# Patient Record
Sex: Male | Born: 1960 | Hispanic: No | Smoking: Never smoker
Health system: Southern US, Community
[De-identification: ages and names within clinical notes are randomized; demographics above are authoritative.]

## PROBLEM LIST (undated history)

## (undated) DIAGNOSIS — E119 Type 2 diabetes mellitus without complications: Secondary | ICD-10-CM

---

## 2019-04-25 ENCOUNTER — Ambulatory Visit: Payer: Self-pay | Attending: Internal Medicine

## 2019-04-25 DIAGNOSIS — Z23 Encounter for immunization: Secondary | ICD-10-CM

## 2019-04-25 NOTE — Progress Notes (Signed)
   Covid-19 Vaccination Clinic  Name:  Kenneth Richards    MRN: [id number] DOB: [birth year]  04/25/2019  [name] was observed post Covid-19 immunization for 15 minutes without incident. He was provided with Vaccine Information Sheet and instruction to access the V-Safe system.   [name] was instructed to call 911 with any severe reactions post vaccine: Marland Kitchen Difficulty breathing  . Swelling of face and throat  . A fast heartbeat  . A bad rash all over body  . Dizziness and weakness   Immunizations Administered    Name Date Dose VIS Date Route   Pfizer COVID-19 Vaccine 04/25/2019  8:54 AM 0.3 mL 12/28/2018 Intramuscular   Manufacturer: [employer], [employer]   Lot: [id number]   NDC: [address]-8

## 2019-05-20 ENCOUNTER — Ambulatory Visit: Payer: Self-pay | Attending: Internal Medicine

## 2019-05-20 DIAGNOSIS — Z23 Encounter for immunization: Secondary | ICD-10-CM

## 2019-05-20 NOTE — Progress Notes (Signed)
   Covid-19 Vaccination Clinic  Name:  Kenneth Richards    MRN: [id number] DOB: 06-02-1960  05/20/2019  [name] was observed post Covid-19 immunization for 15 minutes without incident. He was provided with Vaccine Information Sheet and instruction to access the V-Safe system.   [name] was instructed to call 911 with any severe reactions post vaccine: Marland Kitchen Difficulty breathing  . Swelling of face and throat  . A fast heartbeat  . A bad rash all over body  . Dizziness and weakness   Immunizations Administered    Name Date Dose VIS Date Route   Pfizer COVID-19 Vaccine 05/20/2019  8:18 AM 0.3 mL 03/13/2018 Intramuscular   Manufacturer: [employer], [employer]   Lot: [id number]   NDC: [address]-7

## 2020-10-29 ENCOUNTER — Telehealth: Payer: Self-pay

## 2020-10-29 NOTE — Telephone Encounter (Signed)
Pt to establish as a new patient. Would like to be seen sooner than appt. After [facility], he was told his glucose level was about 400mg /dl. Has only seen a doctor about diabetes years ago when he was still in prediabetic range. Does not take any medication for diabetes.

## 2020-10-30 ENCOUNTER — Emergency Department (HOSPITAL_COMMUNITY): Payer: Self-pay

## 2020-10-30 ENCOUNTER — Other Ambulatory Visit: Payer: Self-pay

## 2020-10-30 ENCOUNTER — Inpatient Hospital Stay (HOSPITAL_COMMUNITY)
Admission: EM | Admit: 2020-10-30 | Discharge: 2020-11-03 | DRG: 854 | Disposition: A | Discharge status: Home / Self Care | Payer: Self-pay | Attending: Family Medicine | Admitting: Family Medicine

## 2020-10-30 ENCOUNTER — Encounter (HOSPITAL_COMMUNITY): Payer: Self-pay | Admitting: Internal Medicine

## 2020-10-30 DIAGNOSIS — E871 Hypo-osmolality and hyponatremia: Secondary | ICD-10-CM

## 2020-10-30 DIAGNOSIS — L97509 Non-pressure chronic ulcer of other part of unspecified foot with unspecified severity: Secondary | ICD-10-CM

## 2020-10-30 DIAGNOSIS — M86071 Acute hematogenous osteomyelitis, right ankle and foot: Secondary | ICD-10-CM

## 2020-10-30 DIAGNOSIS — E08621 Diabetes mellitus due to underlying condition with foot ulcer: Secondary | ICD-10-CM

## 2020-10-30 DIAGNOSIS — L97516 Non-pressure chronic ulcer of other part of right foot with bone involvement without evidence of necrosis: Secondary | ICD-10-CM | POA: Diagnosis present

## 2020-10-30 DIAGNOSIS — E1169 Type 2 diabetes mellitus with other specified complication: Secondary | ICD-10-CM | POA: Diagnosis present

## 2020-10-30 DIAGNOSIS — M869 Osteomyelitis, unspecified: Secondary | ICD-10-CM

## 2020-10-30 DIAGNOSIS — A419 Sepsis, unspecified organism: Principal | ICD-10-CM | POA: Diagnosis present

## 2020-10-30 DIAGNOSIS — M86172 Other acute osteomyelitis, left ankle and foot: Secondary | ICD-10-CM | POA: Diagnosis present

## 2020-10-30 DIAGNOSIS — L03032 Cellulitis of left toe: Secondary | ICD-10-CM | POA: Diagnosis present

## 2020-10-30 DIAGNOSIS — E1165 Type 2 diabetes mellitus with hyperglycemia: Secondary | ICD-10-CM | POA: Diagnosis present

## 2020-10-30 DIAGNOSIS — L97529 Non-pressure chronic ulcer of other part of left foot with unspecified severity: Secondary | ICD-10-CM

## 2020-10-30 DIAGNOSIS — E11622 Type 2 diabetes mellitus with other skin ulcer: Secondary | ICD-10-CM | POA: Diagnosis present

## 2020-10-30 DIAGNOSIS — M86171 Other acute osteomyelitis, right ankle and foot: Secondary | ICD-10-CM | POA: Diagnosis present

## 2020-10-30 DIAGNOSIS — E1152 Type 2 diabetes mellitus with diabetic peripheral angiopathy with gangrene: Secondary | ICD-10-CM | POA: Diagnosis present

## 2020-10-30 DIAGNOSIS — E11621 Type 2 diabetes mellitus with foot ulcer: Secondary | ICD-10-CM

## 2020-10-30 DIAGNOSIS — L97526 Non-pressure chronic ulcer of other part of left foot with bone involvement without evidence of necrosis: Secondary | ICD-10-CM | POA: Diagnosis present

## 2020-10-30 DIAGNOSIS — Z20822 Contact with and (suspected) exposure to covid-19: Secondary | ICD-10-CM | POA: Diagnosis present

## 2020-10-30 DIAGNOSIS — L97519 Non-pressure chronic ulcer of other part of right foot with unspecified severity: Secondary | ICD-10-CM

## 2020-10-30 DIAGNOSIS — E1142 Type 2 diabetes mellitus with diabetic polyneuropathy: Secondary | ICD-10-CM | POA: Diagnosis present

## 2020-10-30 DIAGNOSIS — M86072 Acute hematogenous osteomyelitis, left ankle and foot: Secondary | ICD-10-CM

## 2020-10-30 DIAGNOSIS — E1140 Type 2 diabetes mellitus with diabetic neuropathy, unspecified: Secondary | ICD-10-CM | POA: Diagnosis present

## 2020-10-30 HISTORY — DX: Type 2 diabetes mellitus without complications: E11.9

## 2020-10-30 LAB — CBC WITH DIFFERENTIAL/PLATELET
Abs Immature Granulocytes: 0.14 10*3/uL — ABNORMAL HIGH (ref 0.00–0.07)
Basophils Absolute: 0.1 10*3/uL (ref 0.0–0.1)
Basophils Relative: 0 %
Eosinophils Absolute: 0 10*3/uL (ref 0.0–0.5)
Eosinophils Relative: 0 %
HCT: 36.3 % — ABNORMAL LOW (ref 39.0–52.0)
Hemoglobin: 12.2 g/dL — ABNORMAL LOW (ref 13.0–17.0)
Immature Granulocytes: 1 %
Lymphocytes Relative: 9 %
Lymphs Abs: 1.4 10*3/uL (ref 0.7–4.0)
MCH: 31.2 pg (ref 26.0–34.0)
MCHC: 33.6 g/dL (ref 30.0–36.0)
MCV: 92.8 fL (ref 80.0–100.0)
Monocytes Absolute: 0.6 10*3/uL (ref 0.1–1.0)
Monocytes Relative: 4 %
Neutro Abs: 12.8 10*3/uL — ABNORMAL HIGH (ref 1.7–7.7)
Neutrophils Relative %: 86 %
Platelets: 900 10*3/uL — ABNORMAL HIGH (ref 150–400)
RBC: 3.91 MIL/uL — ABNORMAL LOW (ref 4.22–5.81)
RDW: 12 % (ref 11.5–15.5)
WBC: 15.1 10*3/uL — ABNORMAL HIGH (ref 4.0–10.5)
nRBC: 0 % (ref 0.0–0.2)

## 2020-10-30 LAB — COMPREHENSIVE METABOLIC PANEL
ALT: 34 U/L (ref 0–44)
AST: 51 U/L — ABNORMAL HIGH (ref 15–41)
Albumin: 2.9 g/dL — ABNORMAL LOW (ref 3.5–5.0)
Alkaline Phosphatase: 109 U/L (ref 38–126)
Anion gap: 13 (ref 5–15)
BUN: 15 mg/dL (ref 8–23)
CO2: 21 mmol/L — ABNORMAL LOW (ref 22–32)
Calcium: 9 mg/dL (ref 8.9–10.3)
Chloride: 95 mmol/L — ABNORMAL LOW (ref 98–111)
Creatinine, Ser: 1.05 mg/dL (ref 0.61–1.24)
GFR, Estimated: >60 mL/min (ref >60)
Glucose, Bld: 261 mg/dL — ABNORMAL HIGH (ref 70–99)
Potassium: 4.4 mmol/L (ref 3.5–5.1)
Sodium: 129 mmol/L — ABNORMAL LOW (ref 135–145)
Total Bilirubin: 0.5 mg/dL (ref 0.3–1.2)
Total Protein: 8.3 g/dL — ABNORMAL HIGH (ref 6.5–8.1)

## 2020-10-30 LAB — RESP PANEL BY RT-PCR (FLU A&B, COVID) ARPGX2
Influenza A by PCR: NEGATIVE
Influenza B by PCR: NEGATIVE
SARS Coronavirus 2 by RT PCR: NEGATIVE

## 2020-10-30 LAB — APTT: aPTT: 32 seconds (ref 24–36)

## 2020-10-30 LAB — CBG MONITORING, ED
Glucose-Capillary: 223 mg/dL — ABNORMAL HIGH (ref 70–99)
Glucose-Capillary: 207 mg/dL — ABNORMAL HIGH (ref 70–99)

## 2020-10-30 LAB — PROTIME-INR
INR: 1.1 (ref 0.8–1.2)
Prothrombin Time: 14.2 seconds (ref 11.4–15.2)

## 2020-10-30 LAB — HEMOGLOBIN A1C
Hgb A1c MFr Bld: 10.3 % — ABNORMAL HIGH (ref 4.8–5.6)
Mean Plasma Glucose: 248.91 mg/dL

## 2020-10-30 LAB — LACTIC ACID, PLASMA
Lactic Acid, Venous: 1.9 mmol/L (ref 0.5–1.9)
Lactic Acid, Venous: 2.1 mmol/L (ref 0.5–1.9)

## 2020-10-30 LAB — TSH: TSH: 2.147 u[IU]/mL (ref 0.350–4.500)

## 2020-10-30 MED ORDER — METRONIDAZOLE 500 MG/100ML IV SOLN
500.0000 mg | Freq: Two times a day (BID) | INTRAVENOUS | Status: DC
  Administered 2020-10-30 – 2020-11-02 (×6): 500 mg via INTRAVENOUS
  Filled 2020-10-30 (×6): qty 100

## 2020-10-30 MED ORDER — LACTATED RINGERS IV BOLUS (SEPSIS)
1000.0000 mL | Freq: Once | INTRAVENOUS | Status: AC
  Administered 2020-10-30: 1000 mL via INTRAVENOUS

## 2020-10-30 MED ORDER — ENOXAPARIN SODIUM 40 MG/0.4ML IJ SOSY
40.0000 mg | PREFILLED_SYRINGE | INTRAMUSCULAR | Status: DC
  Administered 2020-10-30: 40 mg via SUBCUTANEOUS
  Filled 2020-10-30: qty 0.4

## 2020-10-30 MED ORDER — INSULIN ASPART 100 UNIT/ML IJ SOLN
0.0000 [IU] | Freq: Three times a day (TID) | INTRAMUSCULAR | Status: DC
  Administered 2020-10-31: 8 [IU] via SUBCUTANEOUS
  Administered 2020-10-31 – 2020-11-01 (×2): 5 [IU] via SUBCUTANEOUS
  Administered 2020-11-01: 8 [IU] via SUBCUTANEOUS
  Administered 2020-11-02: 3 [IU] via SUBCUTANEOUS
  Administered 2020-11-02: 5 [IU] via SUBCUTANEOUS
  Administered 2020-11-02: 2 [IU] via SUBCUTANEOUS
  Administered 2020-11-03: 5 [IU] via SUBCUTANEOUS

## 2020-10-30 MED ORDER — SODIUM CHLORIDE 0.9 % IV SOLN
2.0000 g | INTRAVENOUS | Status: DC
  Administered 2020-10-31 – 2020-11-02 (×3): 2 g via INTRAVENOUS
  Filled 2020-10-30 (×3): qty 20

## 2020-10-30 MED ORDER — VANCOMYCIN HCL 1500 MG/300ML IV SOLN
1500.0000 mg | Freq: Once | INTRAVENOUS | Status: AC
  Administered 2020-10-30: 1500 mg via INTRAVENOUS
  Filled 2020-10-30: qty 300

## 2020-10-30 MED ORDER — ONDANSETRON HCL 4 MG/2ML IJ SOLN
4.0000 mg | Freq: Four times a day (QID) | INTRAMUSCULAR | Status: DC | PRN
  Administered 2020-11-01: 4 mg via INTRAVENOUS
  Filled 2020-10-30: qty 2

## 2020-10-30 MED ORDER — INSULIN ASPART 100 UNIT/ML IJ SOLN
0.0000 [IU] | Freq: Every day | INTRAMUSCULAR | Status: DC
  Administered 2020-10-30: 2 [IU] via SUBCUTANEOUS
  Administered 2020-10-31 – 2020-11-01 (×2): 3 [IU] via SUBCUTANEOUS

## 2020-10-30 MED ORDER — SODIUM CHLORIDE 0.9 % IV SOLN: INTRAVENOUS | Status: DC

## 2020-10-30 MED ORDER — VANCOMYCIN HCL 750 MG/150ML IV SOLN
750.0000 mg | Freq: Two times a day (BID) | INTRAVENOUS | Status: DC
  Administered 2020-10-31 – 2020-11-02 (×5): 750 mg via INTRAVENOUS
  Filled 2020-10-30 (×6): qty 150

## 2020-10-30 MED ORDER — LACTATED RINGERS IV BOLUS (SEPSIS)
1000.0000 mL | Freq: Once | INTRAVENOUS | Status: AC
Start: 2020-10-30 — End: 2020-10-30
  Administered 2020-10-30: 1000 mL via INTRAVENOUS

## 2020-10-30 MED ORDER — LACTATED RINGERS IV SOLN: INTRAVENOUS | Status: DC

## 2020-10-30 MED ORDER — ACETAMINOPHEN 650 MG RE SUPP: 650.0000 mg | Freq: Four times a day (QID) | RECTAL | Status: DC | PRN

## 2020-10-30 MED ORDER — ACETAMINOPHEN 325 MG PO TABS: 650.0000 mg | ORAL_TABLET | Freq: Four times a day (QID) | ORAL | Status: DC | PRN

## 2020-10-30 MED ORDER — LACTATED RINGERS IV BOLUS (SEPSIS)
250.0000 mL | Freq: Once | INTRAVENOUS | Status: AC
  Administered 2020-10-30: 250 mL via INTRAVENOUS

## 2020-10-30 MED ORDER — SODIUM CHLORIDE 0.9 % IV SOLN
2.0000 g | Freq: Once | INTRAVENOUS | Status: AC
  Administered 2020-10-30: 2 g via INTRAVENOUS
  Filled 2020-10-30: qty 20

## 2020-10-30 MED ORDER — ONDANSETRON HCL 4 MG PO TABS: 4.0000 mg | ORAL_TABLET | Freq: Four times a day (QID) | ORAL | Status: DC | PRN

## 2020-10-30 NOTE — Assessment & Plan Note (Signed)
Pt aware he is likely going to need amputations of toe due to nature of his infections. See above plan for MRI, ABI, abx.

## 2020-10-30 NOTE — Progress Notes (Signed)
Pharmacy Antibiotic Note  Kenneth Richards is a 60 y.o. male admitted on 10/30/2020 presenting with foot infection, DFI and concern for sepsis.  Pharmacy has been consulted for vancomycin dosing.  Ceftriaxone per MD  Plan: Vancomycin 1500 mg IV x 1, then 750 mg IV q 12h (eAUC 431, Goal AUC 400-550, SCr 1.05) Monitor renal function, Cx and clinical progression to narrow Vancomycin levels as needed  Height: 6' (182.9 cm) Weight: 72.6 kg (160 lb) IBW/kg (Calculated) : 77.6  Temp (24hrs), Avg:100.1 F (37.8 C), Min:100.1 F (37.8 C), Max:100.1 F (37.8 C)  Recent Labs  Lab 10/30/20 1617 10/30/20 1730  WBC  --  15.1*  CREATININE  --  1.05  LATICACIDVEN 1.9  --     Estimated Creatinine Clearance: 74.9 mL/min (by C-G formula based on SCr of 1.05 mg/dL).    Not on File  Daylene Posey, PharmD Clinical Pharmacist ED Pharmacist Phone # [phone] 10/30/2020 6:18 PM

## 2020-10-30 NOTE — H&P (Addendum)
History and Physical    Kenneth Richards:[id number] DOB: 09-Jan-1961 DOA: 10/30/2020  PCP: Pcp, No   Patient coming from: Home  I have personally briefly reviewed patient's old medical records in [facility]  CC: foot wounds x 2-3 weeks HPI: 60 year old white male with a prior history of type 2 diabetes who takes no medications who presents to the ER today with a 2 to 3-week history of worsening wounds to his feet.  Patient states that he may have dropped a wooden board on them about 2 to 3 weeks ago.  He has very little sensation in his feet.  He states that the skin started coming off of his right first toe and then the third left toe proximal around the same time.  Again does not have any sensation of his feet.  He started noticing odor coming from his feet about 2 weeks ago.  He states that he has been try to clean them at home without success.  The odor is gotten progressively worse.  Denies any fever chills.  Patient takes no medications for his diabetes.  Patient came to the ER for evaluation.  On arrival patient had a low-grade temp of 100.1.  Blood pressure 104/74.  100% on room air  Laboratory evaluation White count 15.1 hemoglobin 12.2 platelets of 900.  Chemistry sodium 129, glucose of 261, BUN of 15 creatinine 1.0, bicarbonate of 21.  Lactic acid was 1.9.  X-ray right foot demonstrated osteomyelitis is of first toe.  Due to the patient's obvious diabetic foot wounds and probable osteomyelitis, [facility] contacted for admission.   ED Course: pt with foul odor from feet. WBC 15. Xray shows acute osteo right foot. Pt admitted for workup.  Review of Systems:  Review of Systems  Constitutional:  Negative for chills and fever.  HENT: Negative.    Eyes: Negative.   Respiratory: Negative.    Cardiovascular: Negative.   Gastrointestinal: Negative.   Genitourinary: Negative.   Musculoskeletal: Negative.   Skin:        Worsening erythema, drainage and foul odor  from feet  Neurological: Negative.        Little to no sensation in his feet  Endo/Heme/Allergies:        Does not check his blood sugar  Psychiatric/Behavioral: Negative.    All other systems reviewed and are negative.  Past Medical History:  Diagnosis Date   Type 2 diabetes mellitus (HCC)     History reviewed. No pertinent surgical history.   reports that he does not drink alcohol and does not use drugs. No history on file for tobacco use.  No Known Allergies  History reviewed. No pertinent family history.  Prior to Admission medications   Medication Sig Start Date End Date Taking? Authorizing Provider  sulfamethoxazole-trimethoprim (BACTRIM DS) 800-160 MG tablet Take 1 tablet by mouth 2 (two) times daily. 10/27/20   [provider]    Physical Exam: Vitals:   10/30/20 1641 10/30/20 1837 10/30/20 1946 10/30/20 2045  BP:  96/69 101/68 126/86  Pulse:  86 85 82  Resp:  17 16 19   Temp:   98.2 F (36.8 C)   TempSrc:   Oral   SpO2:  100% 100% 100%  Weight: 72.6 kg     Height: 6' (1.829 m)       Physical Exam Vitals and nursing note reviewed.  Constitutional:      General: He is not in acute distress.    Appearance: Normal appearance.  He is normal weight. He is not ill-appearing, toxic-appearing or diaphoretic.  HENT:     Head: Normocephalic and atraumatic.     Nose: Nose normal. No rhinorrhea.  Eyes:     General:        Right eye: No discharge.        Left eye: No discharge.  Cardiovascular:     Rate and Rhythm: Normal rate and regular rhythm.     Pulses: Normal pulses.  Pulmonary:     Effort: Pulmonary effort is normal. No respiratory distress.     Breath sounds: Normal breath sounds. No wheezing or rales.  Abdominal:     General: Abdomen is flat. Bowel sounds are normal. There is no distension.     Tenderness: There is no abdominal tenderness. There is no guarding.  Skin:    Capillary Refill: Capillary refill takes less than 2 seconds.      Comments: Ulceration to right 1st toe. Dactylitis of 3rd left toe See pictures  Neurological:     General: No focal deficit present.     Mental Status: He is alert and oriented to person, place, and time.     Comments: Little to no sensation to light touch to plantar aspect of bilateral feet              Labs on Admission: I have personally reviewed following labs and imaging studies  CBC: Recent Labs  Lab 10/30/20 1730  WBC 15.1*  NEUTROABS 12.8*  HGB 12.2*  HCT 36.3*  MCV 92.8  PLT 900*   Basic Metabolic Panel: Recent Labs  Lab 10/30/20 1730  NA 129*  K 4.4  CL 95*  CO2 21*  GLUCOSE 261*  BUN 15  CREATININE 1.05  CALCIUM 9.0   GFR: Estimated Creatinine Clearance: 74.9 mL/min (by C-G formula based on SCr of 1.05 mg/dL). Liver Function Tests: Recent Labs  Lab 10/30/20 1730  AST 51*  ALT 34  ALKPHOS 109  BILITOT 0.5  PROT 8.3*  ALBUMIN 2.9*   No results for input(s): LIPASE, AMYLASE in the last 168 hours. No results for input(s): AMMONIA in the last 168 hours. Coagulation Profile: Recent Labs  Lab 10/30/20 1730  INR 1.1   Cardiac Enzymes: No results for input(s): CKTOTAL, CKMB, CKMBINDEX, TROPONINI in the last 168 hours. BNP (last 3 results) No results for input(s): PROBNP in the last 8760 hours. HbA1C: No results for input(s): HGBA1C in the last 72 hours. CBG: Recent Labs  Lab 10/30/20 1641  GLUCAP 223*   Lipid Profile: No results for input(s): CHOL, HDL, LDLCALC, TRIG, CHOLHDL, LDLDIRECT in the last 72 hours. Thyroid Function Tests: No results for input(s): TSH, T4TOTAL, FREET4, T3FREE, THYROIDAB in the last 72 hours. Anemia Panel: No results for input(s): VITAMINB12, FOLATE, FERRITIN, TIBC, IRON, RETICCTPCT in the last 72 hours. Urine analysis: No results found for: COLORURINE, APPEARANCEUR, LABSPEC, PHURINE, GLUCOSEU, HGBUR, BILIRUBINUR, KETONESUR, PROTEINUR, UROBILINOGEN, NITRITE, LEUKOCYTESUR  Radiological Exams on Admission:  I have personally reviewed images DG Chest 1 View  Result Date: 10/30/2020 CLINICAL DATA:  Questionable sepsis EXAM: CHEST  1 VIEW COMPARISON:  Stent FINDINGS: The heart and mediastinal contours are within normal limits. No focal consolidation. No pulmonary edema. No pleural effusion. No pneumothorax. No acute osseous abnormality. IMPRESSION: No active disease. Electronically Signed   By: Tish Frederickson M.D.   On: 10/30/2020 18:14   DG Foot Complete Left  Result Date: 10/30/2020 CLINICAL DATA:  Bilateral toe infections.  Diabetic. EXAM: LEFT FOOT -  COMPLETE 3+ VIEW COMPARISON:  None. FINDINGS: Soft tissue infection and/or bandage material about the distal third digit. No soft tissue gas. Hallux valgus deformity with degenerative changes of the first metatarsophalangeal joint. Diffuse mild forefoot and midfoot soft tissue swelling. Questionable cortical irregularity involving the distal third digit. No other osseous destruction identified. IMPRESSION: Soft tissue infection and/or bandage material about the distal third digit. Presuming there is bandage material, makes evaluation of osseous structures challenging. Cannot exclude cortical irregularity involving the tuft of the third digit. Consider MRI to exclude osteomyelitis. Electronically Signed   By: Jeronimo Greaves M.D.   On: 10/30/2020 18:19   DG Foot Complete Right  Result Date: 10/30/2020 CLINICAL DATA:  Question sepsis. non compliant diabetic d/t being unaware he is diabetic. Open sore on right foot. Toes significant decay EXAM: RIGHT FOOT COMPLETE - 3+ VIEW COMPARISON:  None. FINDINGS: Markedly limited evaluation due to overlapping gauze overlying the first digit distal phalanx. Cortical erosion and destruction of the lateral first digit distal phalanx. There is no evidence of fracture or dislocation. There is no evidence of severe arthropathy. Subcutaneus soft tissue edema of the first digit. IMPRESSION: Cortical erosion and destruction of the  lateral first digit distal phalanx consistent with osteomyelitis. Electronically Signed   By: Tish Frederickson M.D.   On: 10/30/2020 18:26    EKG: I have personally reviewed EKG: shows NSR   Assessment/Plan Principal Problem:   Bilateral diabetic foot ulcer associated with secondary diabetes mellitus (HCC) Active Problems:   Diabetic foot ulcer (HCC)   Acute osteomyelitis of both feet (HCC)   Type 2 diabetes mellitus with foot ulcer (HCC)   Hyponatremia    Bilateral diabetic foot ulcer associated with secondary diabetes mellitus (HCC) Admit to medical bed. IV rocephin, flagyl and vanco. MRI bilateral feet. Pt has pulses in both feet. Check ABI. Pt will need consults with podiatry, vascular surgery.  Acute osteomyelitis of both feet (HCC) Pt aware he is likely going to need amputations of toe due to nature of his infections. See above plan for MRI, ABI, abx.  Type 2 diabetes mellitus with foot ulcer (HCC) Check A1c. Add SSI.  Hyponatremia Check TSH. Gentle IVF with NS  DVT prophylaxis: Lovenox Code Status: Full Code Family Communication: discussed with pt at bedside. No family at bedside  Disposition Plan: return home  Consults called: none  Admission status: Inpatient, Med-Surg   Carollee Herter, DO [facility] 10/30/2020, 9:16 PM

## 2020-10-30 NOTE — ED Provider Notes (Signed)
[facility] EMERGENCY DEPARTMENT Provider Note   CSN: [id number] Arrival date & time: 10/30/20  1617     History Chief Complaint  Patient presents with   Nail Problem    Kenneth Richards is a 60 y.o. male.  The history is provided by the patient and medical records. No language interpreter was used.  Rash Location:  Toe and foot Foot rash location:  L toes and R toes Toe rash location:  L third toe and R great toe Quality: blistering and redness   Severity:  Moderate Onset quality:  Gradual Duration:  2 weeks Timing:  Constant Progression:  Worsening Chronicity:  New Relieved by:  Nothing Worsened by:  Nothing Ineffective treatments:  None tried Associated symptoms: induration   Associated symptoms: no abdominal pain, no diarrhea, no fatigue, no fever, no headaches, no nausea, no shortness of breath, not vomiting and not wheezing       No past medical history on file.  There are no problems to display for this patient.   History reviewed. No pertinent surgical history.     No family history on file.     Home Medications Prior to Admission medications   Not on File    Allergies    Patient has no allergy information on record.  Review of Systems   Review of Systems  Constitutional:  Negative for chills, diaphoresis, fatigue and fever.  HENT:  Negative for congestion.   Eyes:  Negative for visual disturbance.  Respiratory:  Negative for cough, chest tightness, shortness of breath and wheezing.   Cardiovascular:  Negative for chest pain.  Gastrointestinal:  Negative for abdominal pain, constipation, diarrhea, nausea and vomiting.  Genitourinary:  Negative for dysuria and frequency.  Musculoskeletal:  Negative for back pain, neck pain and neck stiffness.  Skin:  Positive for rash and wound.  Neurological:  Negative for headaches.  Psychiatric/Behavioral:  Negative for agitation.   All other systems reviewed and are  negative.  Physical Exam Updated Vital Signs BP 101/68 (BP Location: Left Arm)   Pulse 85   Temp 98.2 F (36.8 C) (Oral)   Resp 16   Ht 6' (1.829 m)   Wt 72.6 kg   SpO2 100%   BMI 21.70 kg/m   Physical Exam Vitals and nursing note reviewed.  Constitutional:      General: He is not in acute distress.    Appearance: He is well-developed. He is not ill-appearing, toxic-appearing or diaphoretic.  HENT:     Head: Normocephalic and atraumatic.     Nose: No congestion or rhinorrhea.     Mouth/Throat:     Mouth: Mucous membranes are moist.     Pharynx: No oropharyngeal exudate or posterior oropharyngeal erythema.  Eyes:     Extraocular Movements: Extraocular movements intact.     Conjunctiva/sclera: Conjunctivae normal.     Pupils: Pupils are equal, round, and reactive to light.  Cardiovascular:     Rate and Rhythm: Normal rate and regular rhythm.     Heart sounds: No murmur heard. Pulmonary:     Effort: Pulmonary effort is normal. No respiratory distress.     Breath sounds: Normal breath sounds. No wheezing, rhonchi or rales.  Chest:     Chest wall: No tenderness.  Abdominal:     General: Abdomen is flat.     Palpations: Abdomen is soft.     Tenderness: There is no abdominal tenderness. There is no right CVA tenderness, left CVA tenderness, guarding or  rebound.  Musculoskeletal:        General: No tenderness.     Cervical back: Neck supple.  Skin:    General: Skin is warm and dry.     Capillary Refill: Capillary refill takes less than 2 seconds.     Findings: Erythema and rash present.  Neurological:     General: No focal deficit present.     Mental Status: He is alert.  Psychiatric:        Mood and Affect: Mood normal.               ED Results / Procedures / Treatments   Labs (all labs ordered are listed, but only abnormal results are displayed) Labs Reviewed  LACTIC ACID, PLASMA - Abnormal; Notable for the following components:      Result Value    Lactic Acid, Venous 2.1 (*)    All other components within normal limits  COMPREHENSIVE METABOLIC PANEL - Abnormal; Notable for the following components:   Sodium 129 (*)    Chloride 95 (*)    CO2 21 (*)    Glucose, Bld 261 (*)    Total Protein 8.3 (*)    Albumin 2.9 (*)    AST 51 (*)    All other components within normal limits  CBC WITH DIFFERENTIAL/PLATELET - Abnormal; Notable for the following components:   WBC 15.1 (*)    RBC 3.91 (*)    Hemoglobin 12.2 (*)    HCT 36.3 (*)    Platelets 900 (*)    Neutro Abs 12.8 (*)    Abs Immature Granulocytes 0.14 (*)    All other components within normal limits  CBG MONITORING, ED - Abnormal; Notable for the following components:   Glucose-Capillary 223 (*)    All other components within normal limits  CBG MONITORING, ED - Abnormal; Notable for the following components:   Glucose-Capillary 207 (*)    All other components within normal limits  RESP PANEL BY RT-PCR (FLU A&B, COVID) ARPGX2  CULTURE, BLOOD (ROUTINE X 2)  CULTURE, BLOOD (ROUTINE X 2)  URINE CULTURE  LACTIC ACID, PLASMA  PROTIME-INR  APTT  TSH  URINALYSIS, ROUTINE W REFLEX MICROSCOPIC  MISCELLANEOUS GENETIC TEST  HEMOGLOBIN A1C  HIV ANTIBODY (ROUTINE TESTING W REFLEX)  CREATININE, SERUM  SEDIMENTATION RATE  C-REACTIVE PROTEIN  PREALBUMIN  CBC WITH DIFFERENTIAL/PLATELET  COMPREHENSIVE METABOLIC PANEL    EKG None  Radiology DG Chest 1 View  Result Date: 10/30/2020 CLINICAL DATA:  Questionable sepsis EXAM: CHEST  1 VIEW COMPARISON:  Stent FINDINGS: The heart and mediastinal contours are within normal limits. No focal consolidation. No pulmonary edema. No pleural effusion. No pneumothorax. No acute osseous abnormality. IMPRESSION: No active disease. Electronically Signed   By: Tish Frederickson M.D.   On: 10/30/2020 18:14   DG Foot Complete Left  Result Date: 10/30/2020 CLINICAL DATA:  Bilateral toe infections.  Diabetic. EXAM: LEFT FOOT - COMPLETE 3+ VIEW  COMPARISON:  None. FINDINGS: Soft tissue infection and/or bandage material about the distal third digit. No soft tissue gas. Hallux valgus deformity with degenerative changes of the first metatarsophalangeal joint. Diffuse mild forefoot and midfoot soft tissue swelling. Questionable cortical irregularity involving the distal third digit. No other osseous destruction identified. IMPRESSION: Soft tissue infection and/or bandage material about the distal third digit. Presuming there is bandage material, makes evaluation of osseous structures challenging. Cannot exclude cortical irregularity involving the tuft of the third digit. Consider MRI to exclude osteomyelitis. Electronically Signed  By: Jeronimo Greaves M.D.   On: 10/30/2020 18:19   DG Foot Complete Right  Result Date: 10/30/2020 CLINICAL DATA:  Question sepsis. non compliant diabetic d/t being unaware he is diabetic. Open sore on right foot. Toes significant decay EXAM: RIGHT FOOT COMPLETE - 3+ VIEW COMPARISON:  None. FINDINGS: Markedly limited evaluation due to overlapping gauze overlying the first digit distal phalanx. Cortical erosion and destruction of the lateral first digit distal phalanx. There is no evidence of fracture or dislocation. There is no evidence of severe arthropathy. Subcutaneus soft tissue edema of the first digit. IMPRESSION: Cortical erosion and destruction of the lateral first digit distal phalanx consistent with osteomyelitis. Electronically Signed   By: Tish Frederickson M.D.   On: 10/30/2020 18:26    Procedures Procedures   CRITICAL CARE Performed by: Canary Brim Rexton Greulich Total critical care time: 35 minutes Critical care time was exclusive of separately billable procedures and treating other patients. Critical care was necessary to treat or prevent imminent or life-threatening deterioration. Critical care was time spent personally by me on the following activities: development of treatment plan with patient and/or  surrogate as well as nursing, discussions with consultants, evaluation of patient's response to treatment, examination of patient, obtaining history from patient or surrogate, ordering and performing treatments and interventions, ordering and review of laboratory studies, ordering and review of radiographic studies, pulse oximetry and re-evaluation of patient's condition.   Medications Ordered in ED Medications  lactated ringers infusion ( Intravenous Not Given 10/30/20 2211)  vancomycin (VANCOREADY) IVPB 1500 mg/300 mL (1,500 mg Intravenous New Bag/Given 10/30/20 2229)  vancomycin (VANCOREADY) IVPB 750 mg/150 mL (has no administration in time range)  insulin aspart (novoLOG) injection 0-15 Units (has no administration in time range)  insulin aspart (novoLOG) injection 0-5 Units (2 Units Subcutaneous Given 10/30/20 2230)  enoxaparin (LOVENOX) injection 40 mg (40 mg Subcutaneous Given 10/30/20 2232)  acetaminophen (TYLENOL) tablet 650 mg (has no administration in time range)    Or  acetaminophen (TYLENOL) suppository 650 mg (has no administration in time range)  ondansetron (ZOFRAN) tablet 4 mg (has no administration in time range)    Or  ondansetron (ZOFRAN) injection 4 mg (has no administration in time range)  cefTRIAXone (ROCEPHIN) 2 g in sodium chloride 0.9 % 100 mL IVPB (has no administration in time range)  metroNIDAZOLE (FLAGYL) IVPB 500 mg (500 mg Intravenous New Bag/Given 10/30/20 2230)  0.9 %  sodium chloride infusion ( Intravenous New Bag/Given 10/30/20 2229)  lactated ringers bolus 1,000 mL (0 mLs Intravenous Stopped 10/30/20 2227)    And  lactated ringers bolus 1,000 mL (0 mLs Intravenous Stopped 10/30/20 2227)    And  lactated ringers bolus 250 mL (0 mLs Intravenous Stopped 10/30/20 2254)  cefTRIAXone (ROCEPHIN) 2 g in sodium chloride 0.9 % 100 mL IVPB (0 g Intravenous Stopped 10/30/20 2227)    ED Course  I have reviewed the triage vital signs and the nursing  notes.  Pertinent labs & imaging results that were available during my care of the patient were reviewed by me and considered in my medical decision making (see chart for details).    MDM Rules/Calculators/A&P                           SPENCE SOBERANO is a 60 y.o. male with a reported recent diagnosis of diabetes who presents with foot infection.  Patient reports that for the last few weeks he has had concern  for wounds on his toes and over the last several days started to worsen.  He is concerned they are very infected because they have foul smell and discharge.  He reports he has peripheral neuropathy and does not have much sensation at baseline.  He denies any systemic signs infection with no fevers, chills, nausea vomiting, constipation, diarrhea, or other complaints.  He says that the redness started streaking up his left foot coming from the left third toe.  He reports he also has his right great toe appears infected as does the lateral right foot.  Otherwise he denies other complaints.  On exam, patient has foul-smelling wounds on his toes.  See clinical photos.  Had a temperature 100.1 on arrival and did feel warm to the touch.  Lungs clear and chest nontender.  Abdomen nontender.  Redness was going from the foot.  Patient had work-up started in triage including x-ray showing evidence of osteomyelitis.  Due to the appearance of the toes, I am concerned about osteomyelitis and developing gangrene.  Patient was given broad-spectrum antibiotics and he will be admitted to medicine for further management.      Final Clinical Impression(s) / ED Diagnoses Final diagnoses:  Sepsis (HCC)  Osteomyelitis of left foot, unspecified type (HCC)     Clinical Impression: 1. Sepsis (HCC)   2. Osteomyelitis of left foot, unspecified type (HCC)     Disposition: Admit  This note was prepared with assistance of Dragon voice recognition software. Occasional wrong-word or sound-a-like substitutions  may have occurred due to the inherent limitations of voice recognition software.     Gale Klar, Canary Brim, MD 10/31/20 [phone]

## 2020-10-30 NOTE — Assessment & Plan Note (Signed)
Admit to medical bed. IV rocephin, flagyl and vanco. MRI bilateral feet. Pt has pulses in both feet. Check ABI. Pt will need consults with podiatry, vascular surgery.

## 2020-10-30 NOTE — Assessment & Plan Note (Signed)
Check A1c. Add SSI. 

## 2020-10-30 NOTE — ED Notes (Signed)
MD Tegeler notified of lactic

## 2020-10-30 NOTE — ED Triage Notes (Signed)
Pt here POV d/t bilateral toe infections. Pt non compliant diabetic d/t being unaware he is diabetic. Pedal pulses intact. Open sore on right foot. Toes significant decay.

## 2020-10-30 NOTE — Assessment & Plan Note (Signed)
Check TSH. Gentle IVF with NS

## 2020-10-30 NOTE — Subjective & Objective (Signed)
CC: foot wounds x 2-3 weeks HPI: 60 year old white male with a prior history of type 2 diabetes who takes no medications who presents to the ER today with a 2 to 3-week history of worsening wounds to his feet.  Patient states that he may have dropped a wooden board on them about 2 to 3 weeks ago.  He has very little sensation in his feet.  He states that the skin started coming off of his right first toe and then the third left toe proximal around the same time.  Again does not have any sensation of his feet.  He started noticing odor coming from his feet about 2 weeks ago.  He states that he has been try to clean them at home without success.  The odor is gotten progressively worse.  Denies any fever chills.  Patient takes no medications for his diabetes.  Patient came to the ER for evaluation.  On arrival patient had a low-grade temp of 100.1.  Blood pressure 104/74.  100% on room air  Laboratory evaluation White count 15.1 hemoglobin 12.2 platelets of 900.  Chemistry sodium 129, glucose of 261, BUN of 15 creatinine 1.0, bicarbonate of 21.  Lactic acid was 1.9.  X-ray right foot demonstrated osteomyelitis is of first toe.  Due to the patient's obvious diabetic foot wounds and probable osteomyelitis, [facility] hospitalist contacted for admission.

## 2020-10-30 NOTE — ED Provider Notes (Signed)
Emergency Medicine Provider Triage Evaluation Note  Kenneth Richards , a 60 y.o. male  was evaluated in triage.  Pt complains of foot infection.  Review of Systems  Positive: Infection to bilateral foot, pain Negative: Fever, n/v  Physical Exam  BP 104/74 (BP Location: Right Arm)   Pulse 97   Temp 100.1 F (37.8 C) (Oral)   Resp 16   Ht 6' (1.829 m)   Wt 72.6 kg   SpO2 100%   BMI 21.70 kg/m  Gen:   Awake, no distress   Resp:  Normal effort  MSK:   Moves extremities without difficulty  Other:  Diabetic ulcers noted to multiple spots on both feet, with grossly infected toes to both feet.  Erythema to dorsum of L foot  Medical Decision Making  Medically screening exam initiated at 4:50 PM.  Appropriate orders placed.  Celene Squibb was informed that the remainder of the evaluation will be completed by another provider, this initial triage assessment does not replace that evaluation, and the importance of remaining in the ED until their evaluation is complete.  Pt with diabetic foot ulcers that appears infected, especially the L foot involving multiple toes.  Concern for sepsis.  Code sepsis initiated.    Fayrene Helper, PA-C 10/30/20 1657    Terrilee Files, MD 10/30/20 [phone]

## 2020-10-30 NOTE — Sepsis Progress Note (Signed)
Code Sepsis protocol being monitored by eLink. 

## 2020-10-30 NOTE — ED Notes (Signed)
Lab called 2 to add on creatinine, sed rate, CRP, and prealbumin

## 2020-10-31 ENCOUNTER — Inpatient Hospital Stay (HOSPITAL_COMMUNITY): Payer: Self-pay

## 2020-10-31 LAB — URINALYSIS, ROUTINE W REFLEX MICROSCOPIC
Bilirubin Urine: NEGATIVE
Glucose, UA: 150 mg/dL — AB
Hgb urine dipstick: NEGATIVE
Ketones, ur: NEGATIVE mg/dL
Leukocytes,Ua: NEGATIVE
Nitrite: NEGATIVE
Protein, ur: NEGATIVE mg/dL
Specific Gravity, Urine: 1.009 (ref 1.005–1.030)
pH: 6 (ref 5.0–8.0)

## 2020-10-31 LAB — HIV ANTIBODY (ROUTINE TESTING W REFLEX): HIV Screen 4th Generation wRfx: NONREACTIVE

## 2020-10-31 LAB — COMPREHENSIVE METABOLIC PANEL
ALT: 26 U/L (ref 0–44)
AST: 22 U/L (ref 15–41)
Albumin: 2.2 g/dL — ABNORMAL LOW (ref 3.5–5.0)
Alkaline Phosphatase: 88 U/L (ref 38–126)
Anion gap: 8 (ref 5–15)
BUN: 11 mg/dL (ref 8–23)
CO2: 22 mmol/L (ref 22–32)
Calcium: 8.2 mg/dL — ABNORMAL LOW (ref 8.9–10.3)
Chloride: 101 mmol/L (ref 98–111)
Creatinine, Ser: 0.77 mg/dL (ref 0.61–1.24)
GFR, Estimated: >60 mL/min (ref >60)
Glucose, Bld: 238 mg/dL — ABNORMAL HIGH (ref 70–99)
Potassium: 3.9 mmol/L (ref 3.5–5.1)
Sodium: 131 mmol/L — ABNORMAL LOW (ref 135–145)
Total Bilirubin: 0.5 mg/dL (ref 0.3–1.2)
Total Protein: 6.6 g/dL (ref 6.5–8.1)

## 2020-10-31 LAB — CBC WITH DIFFERENTIAL/PLATELET
Abs Immature Granulocytes: 0.16 10*3/uL — ABNORMAL HIGH (ref 0.00–0.07)
Basophils Absolute: 0.1 10*3/uL (ref 0.0–0.1)
Basophils Relative: 1 %
Eosinophils Absolute: 0 10*3/uL (ref 0.0–0.5)
Eosinophils Relative: 0 %
HCT: 32.3 % — ABNORMAL LOW (ref 39.0–52.0)
Hemoglobin: 11.1 g/dL — ABNORMAL LOW (ref 13.0–17.0)
Immature Granulocytes: 1 %
Lymphocytes Relative: 14 %
Lymphs Abs: 1.9 10*3/uL (ref 0.7–4.0)
MCH: 31.4 pg (ref 26.0–34.0)
MCHC: 34.4 g/dL (ref 30.0–36.0)
MCV: 91.5 fL (ref 80.0–100.0)
Monocytes Absolute: 0.7 10*3/uL (ref 0.1–1.0)
Monocytes Relative: 5 %
Neutro Abs: 10.4 10*3/uL — ABNORMAL HIGH (ref 1.7–7.7)
Neutrophils Relative %: 79 %
Platelets: 765 10*3/uL — ABNORMAL HIGH (ref 150–400)
RBC: 3.53 MIL/uL — ABNORMAL LOW (ref 4.22–5.81)
RDW: 12.2 % (ref 11.5–15.5)
WBC: 13.3 10*3/uL — ABNORMAL HIGH (ref 4.0–10.5)
nRBC: 0 % (ref 0.0–0.2)

## 2020-10-31 LAB — PREALBUMIN: Prealbumin: 6.6 mg/dL — ABNORMAL LOW (ref 18–38)

## 2020-10-31 LAB — GLUCOSE, CAPILLARY
Glucose-Capillary: 223 mg/dL — ABNORMAL HIGH (ref 70–99)
Glucose-Capillary: 281 mg/dL — ABNORMAL HIGH (ref 70–99)
Glucose-Capillary: 114 mg/dL — ABNORMAL HIGH (ref 70–99)

## 2020-10-31 LAB — SEDIMENTATION RATE: Sed Rate: 109 mm/hr — ABNORMAL HIGH (ref 0–16)

## 2020-10-31 LAB — C-REACTIVE PROTEIN: CRP: 8.2 mg/dL — ABNORMAL HIGH (ref <1.0)

## 2020-10-31 MED ORDER — GADOBUTROL 1 MMOL/ML IV SOLN
7.5000 mL | Freq: Once | INTRAVENOUS | Status: AC | PRN
  Administered 2020-10-31: 7.5 mL via INTRAVENOUS

## 2020-10-31 MED ORDER — COVID-19MRNA BIVAL VACC PFIZER 30 MCG/0.3ML IM SUSP
0.3000 mL | Freq: Once | INTRAMUSCULAR | Status: AC
  Filled 2020-10-31: qty 0.3

## 2020-10-31 NOTE — Consult Note (Signed)
WOC Nurse Consult Note: Patient receiving care in [facility] 5N21 Reason for Consult: LE wounds Wound type: Diabetic foot ulcer right great toe with osteomyelitis and wet gangrene. Diabetic foot ulcer left third toe with osteomyelitis, wet gangrene, and cellulitis. Diabetic insensate neuropathy. Uncontrolled diabetes.Current recommendation is amputation of the right great toe and left third toe, possible partial ray resection which will be completed by Dr. Odis Hollingshead with [facility]. WOC will sign off at this time but are available to this patient and the medical team if needed.  Thank you for the consult. WOC nurse will not follow at this time.   Please re-consult the WOC team if needed.  Renaldo Reel Katrinka Blazing, MSN, RN, CMSRN, Angus Seller, [facility] Wound Treatment Associate Pager [phone]

## 2020-10-31 NOTE — Plan of Care (Signed)
  Problem: Safety: Goal: Ability to remain free from injury will improve Outcome: Progressing   

## 2020-10-31 NOTE — Progress Notes (Addendum)
PROGRESS NOTE   Kenneth Richards  TDS:[id number] DOB: 1960/10/31 DOA: 10/30/2020 PCP: Pcp, No  Brief Narrative:  60 year old male diet-controlled diabetes mellitus not on any meds--2-3 week h/o leg wound which worsened Found septic on admit with foul smelling toes T-max 100.1 white count 15   Hospital-Problem based course  Sepsis secondary to possible gangrene underlying osteo of toes of left foot Continue ceftriaxone Flagyl vancomycin broad-spectrum Xarelto to be seen by orthopedics Will need plannin likely for amputation--NPO after midnight--will be operatedin by Dr. Blanchie Dessert  Saline 75 cc/H DM TY 2 previously diet-controlled A1c this admit 10.3 Will need insulin dosing will need teaching  start Semglee 12 units for long acting coverage post op Cont SSI and add meal time insulin post op Continue mod SSI--CBGs ranging 202-240  DVT prophylaxis: lovenox Code Status: f Family Communication: none Disposition:  Status is: Inpatient  Remains inpatient appropriate because: surgery       Consultants:  ortho  Procedures:   Antimicrobials:     Subjective: Awake coherent no pain Not on meds  Objective: Vitals:   10/31/20 0330 10/31/20 0400 10/31/20 0442 10/31/20 0753  BP: 108/64 119/73 111/73 118/70  Pulse: 75 72 73 68  Resp: 16 14 20 17   Temp: 97.9 F (36.6 C)  98.7 F (37.1 C) 98.1 F (36.7 C)  TempSrc:   Oral Oral  SpO2: 98% 100% 100% 100%  Weight:      Height:        Intake/Output Summary (Last 24 hours) at 10/31/2020 0934 Last data filed at 10/31/2020 11/02/2020 Gross per 24 hour  Intake 843.58 ml  Output 1000 ml  Net -156.42 ml   Filed Weights   10/30/20 1641  Weight: 72.6 kg    Examination:  Awake coherent in and no focal deficit Cta b no rales rhonci or wheeze Abd soft nt nd no rebound Gangrenous changes to LE's over toes of both R and L foot Neuro intact no focal deficit S1 s2 no m/r/g  Data Reviewed: personally reviewed   CBC     Component Value Date/Time   WBC 13.3 (H) 10/31/2020 0315   RBC 3.53 (L) 10/31/2020 0315   HGB 11.1 (L) 10/31/2020 0315   HCT 32.3 (L) 10/31/2020 0315   PLT 765 (H) 10/31/2020 0315   MCV 91.5 10/31/2020 0315   MCH 31.4 10/31/2020 0315   MCHC 34.4 10/31/2020 0315   RDW 12.2 10/31/2020 0315   LYMPHSABS 1.9 10/31/2020 0315   MONOABS 0.7 10/31/2020 0315   EOSABS 0.0 10/31/2020 0315   BASOSABS 0.1 10/31/2020 0315   CMP Latest Ref Rng & Units 10/31/2020 10/30/2020 10/30/2020  Glucose 70 - 99 mg/dL 11/01/2020) - 355(H)  BUN 8 - 23 mg/dL 11 - 15  Creatinine 741(U - 1.24 mg/dL 3.84 5.36 4.68  Sodium 135 - 145 mmol/L 131(L) - 129(L)  Potassium 3.5 - 5.1 mmol/L 3.9 - 4.4  Chloride 98 - 111 mmol/L 101 - 95(L)  CO2 22 - 32 mmol/L 22 - 21(L)  Calcium 8.9 - 10.3 mg/dL 8.2(L) - 9.0  Total Protein 6.5 - 8.1 g/dL 6.6 - 8.3(H)  Total Bilirubin 0.3 - 1.2 mg/dL 0.5 - 0.5  Alkaline Phos 38 - 126 U/L 88 - 109  AST 15 - 41 U/L 22 - 51(H)  ALT 0 - 44 U/L 26 - 34     Radiology Studies: DG Chest 1 View  Result Date: 10/30/2020 CLINICAL DATA:  Questionable sepsis EXAM: CHEST  1 VIEW COMPARISON:  Stent FINDINGS:  The heart and mediastinal contours are within normal limits. No focal consolidation. No pulmonary edema. No pleural effusion. No pneumothorax. No acute osseous abnormality. IMPRESSION: No active disease. Electronically Signed   By: Tish Frederickson M.D.   On: 10/30/2020 18:14   DG Foot Complete Left  Result Date: 10/30/2020 CLINICAL DATA:  Bilateral toe infections.  Diabetic. EXAM: LEFT FOOT - COMPLETE 3+ VIEW COMPARISON:  None. FINDINGS: Soft tissue infection and/or bandage material about the distal third digit. No soft tissue gas. Hallux valgus deformity with degenerative changes of the first metatarsophalangeal joint. Diffuse mild forefoot and midfoot soft tissue swelling. Questionable cortical irregularity involving the distal third digit. No other osseous destruction identified. IMPRESSION: Soft  tissue infection and/or bandage material about the distal third digit. Presuming there is bandage material, makes evaluation of osseous structures challenging. Cannot exclude cortical irregularity involving the tuft of the third digit. Consider MRI to exclude osteomyelitis. Electronically Signed   By: Jeronimo Greaves M.D.   On: 10/30/2020 18:19   DG Foot Complete Right  Result Date: 10/30/2020 CLINICAL DATA:  Question sepsis. non compliant diabetic d/t being unaware he is diabetic. Open sore on right foot. Toes significant decay EXAM: RIGHT FOOT COMPLETE - 3+ VIEW COMPARISON:  None. FINDINGS: Markedly limited evaluation due to overlapping gauze overlying the first digit distal phalanx. Cortical erosion and destruction of the lateral first digit distal phalanx. There is no evidence of fracture or dislocation. There is no evidence of severe arthropathy. Subcutaneus soft tissue edema of the first digit. IMPRESSION: Cortical erosion and destruction of the lateral first digit distal phalanx consistent with osteomyelitis. Electronically Signed   By: Tish Frederickson M.D.   On: 10/30/2020 18:26     Scheduled Meds:  enoxaparin (LOVENOX) injection  40 mg Subcutaneous Q24H   insulin aspart  0-15 Units Subcutaneous TID WC   insulin aspart  0-5 Units Subcutaneous QHS   Continuous Infusions:  sodium chloride 75 mL/hr at 10/30/20 2229   cefTRIAXone (ROCEPHIN)  IV     lactated ringers     metronidazole Stopped (10/31/20 0056)   vancomycin       LOS: 1 day   Time spent: 25  Rhetta Mura, MD [facility] To contact the attending provider between 7A-7P or the covering provider during after hours 7P-7A, please log into the web site [email or web address] and access using universal [place] password for that web site. If you do not have the password, please call the hospital operator.  10/31/2020, 9:34 AM

## 2020-10-31 NOTE — Progress Notes (Signed)
Pt left unit for MRI.

## 2020-10-31 NOTE — Progress Notes (Signed)
Inpatient Diabetes Program Recommendations  AACE/ADA: New Consensus Statement on Inpatient Glycemic Control (2015)  Target Ranges:  Prepandial:   less than 140 mg/dL      Peak postprandial:   less than 180 mg/dL (1-2 hours)      Critically ill patients:  140 - 180 mg/dL   Lab Results  Component Value Date   GLUCAP 223 (H) 10/31/2020   HGBA1C 10.3 (H) 10/30/2020    Review of Glycemic Control Results for Kenneth Richards, Kenneth Richards (MRN [id number]) as of 10/31/2020 09:22  Ref. Range 10/30/2020 16:41 10/30/2020 21:59 10/31/2020 07:49  Glucose-Capillary Latest Ref Range: 70 - 99 mg/dL 076 (H) 808 (H) 811 (H)   Diabetes history: DM 2 Outpatient Diabetes medications:  None Current orders for Inpatient glycemic control:  Novolog moderate tid with meals and HS Inpatient Diabetes Program Recommendations:    Please consider adding Semglee 14 units daily.  Patient will need medications for DM at d/c.  Diabetes coordinator will see patient on 10/17 to discuss lifestyle modifications as well as medications.    Thanks,  Beryl Meager, RN, BC-ADM Inpatient Diabetes Coordinator Pager [phone]  (8a-5p)

## 2020-10-31 NOTE — ED Notes (Signed)
Per Dr Imogene Burn leave pts feet uncovered until pt goes to MRI

## 2020-10-31 NOTE — Consult Note (Addendum)
ORTHOPAEDIC CONSULTATION  REQUESTING PHYSICIAN: Rhetta Mura, MD  PCP:  Pcp, No  Chief Complaint: Bilateral diabetic foot ulcers  HPI: Kenneth Richards is a 60 y.o. male who complains of bilateral diabetic foot wounds present for a few weeks.  Patient went to his primary care doctor last week and was then referred to the ER.  He lives alone.  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  Patient states that he has not been on medication for his diabetes recently.  He reports chronic sensory changes to his feet.  In the emergency department, he was found to have a low-grade temperature of 100.1.  His vital signs were stable.  Blood glucose 261.  White blood cell count elevated at 15.1, down to 13.3 today.  He was found to have wet gangrene of the right great toe and left third toe with foul-smelling purulent discharge.  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  He was given IV antibiotics and admitted by the hospitalist.  Orthopedic consultation was placed for management of his bilateral foot wounds.  Past Medical History:  Diagnosis Date   Type 2 diabetes mellitus (HCC)    History reviewed. No pertinent surgical history. Social History   Socioeconomic History   Marital status: Unknown    Spouse name: Not on file   Number of children: Not on file   Years of education: Not on file   Highest education level: Not on file  Occupational History   Not on file  Tobacco Use   Smoking status: Never   Smokeless tobacco: Not on file  Substance and Sexual Activity   Alcohol use: Never   Drug use: Never   Sexual activity: Not on file  Other Topics Concern   Not on file  Social History Narrative   Not on file   Social Determinants of Health   Financial Resource Strain: Not on file  Food  Insecurity: Not on file  Transportation Needs: Not on file  Physical Activity: Not on file  Stress: Not on file  Social Connections: Not on file   History reviewed. No pertinent family history. No Known Allergies Prior to Admission medications   Medication Sig Start Date End Date Taking? Authorizing Provider  sulfamethoxazole-trimethoprim (BACTRIM DS) 800-160 MG tablet Take 1 tablet by mouth 2 (two) times daily. 10/27/20  Yes [provider]   DG Chest 1 View  Result Date: 10/30/2020 CLINICAL DATA:  Questionable sepsis EXAM: CHEST  1 VIEW COMPARISON:  Stent FINDINGS: The heart and mediastinal contours are within normal limits. No focal consolidation. No pulmonary edema. No pleural effusion. No pneumothorax. No acute osseous abnormality. IMPRESSION: No active disease. Electronically Signed   By: Tish Frederickson M.D.   On: 10/30/2020 18:14   MR FOOT RIGHT W WO CONTRAST  Result Date: 10/31/2020 CLINICAL DATA:  Diabetic foot wound at the right great toe EXAM: MRI OF THE RIGHT FOREFOOT WITHOUT AND WITH CONTRAST TECHNIQUE: Multiplanar, multisequence MR imaging of the right forefoot was performed before and after the administration of intravenous contrast. CONTRAST:  7.7mL GADAVIST GADOBUTROL 1 MMOL/ML IV SOLN COMPARISON:  X-ray 10/30/2020 FINDINGS: Bones/Joint/Cartilage Cortical erosion involving the distal phalanx of the right great toe with marked bone marrow edema, enhancement, and confluent low T1 signal changes compatible with acute osteomyelitis. There  is a ulceration along the plantar surface of the great toe distally with peripherally enhancing ulcer base surrounding the distal phalanx of the great toe. Collection, contiguous with the ulcer base, measures approximately 2.3 x 1.7 x 2.2 cm (series 5, image 19). Preserved bone marrow signal within the proximal phalanx of the great toe. No additional sites of bony destruction or marrow replacement. No acute fracture. 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 Ligaments Intact Lisfranc ligament.  No evidence of acute ligamentous injury. Muscles and Tendons Denervation changes of the foot musculature. No significant tenosynovial fluid collection. Soft tissues Great toe ulcer with associated collection and surrounding soft tissue edema/cellulitis. No additional fluid collections or ulcerations. IMPRESSION: 1. Acute osteomyelitis of the distal phalanx of the right great toe. 2. Ulceration underlying the plantar aspect of the right great toe with contiguous 2.2 cm peripherally-enhancing abscess. 3. 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 Electronically Signed   By: Duanne Guess D.O.   On: 10/31/2020 11:38   MR FOOT LEFT W WO CONTRAST  Result Date: 10/31/2020 CLINICAL DATA:  Diabetic foot wound involving the left third toe EXAM: MRI OF THE LEFT FOREFOOT WITHOUT AND WITH CONTRAST TECHNIQUE: Multiplanar, multisequence MR imaging of the left foot was performed both before and after administration of intravenous contrast. CONTRAST:  7.25mL GADAVIST GADOBUTROL 1 MMOL/ML IV SOLN COMPARISON:  X-ray 10/30/2020 FINDINGS: Bones/Joint/Cartilage Bony destruction involving the distal phalanx of the left third toe with associated confluent low T1 marrow signal compatible with acute osteomyelitis. Mild bone marrow edema within the middle phalanx of the third toe without definite bony destruction or marrow replacement. Remaining osseous structures of the left foot are intact. No fracture. No dislocation. No additional sites of bone destruction or marrow replacement. 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 Ligaments Intact Lisfranc ligament. No evidence of acute ligamentous injury of the foot. Muscles and Tendons Denervation changes of the intrinsic foot musculature. No significant tenosynovial fluid collection. Soft tissues Ulceration involving the distal aspect of the third toe with soft  tissue swelling and edema. No organized or rim enhancing fluid collection. Small volume third intermetatarsal space bursitis. IMPRESSION: 1. 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 2. Ulceration involving the distal aspect of the left third toe with associated cellulitis. No abscess. 3. Mild bone marrow edema within the middle phalanx of the third toe without definite bony destruction or marrow replacement, suggesting reactive osteitis. 4. Small volume third intermetatarsal space bursitis. 5. 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 Electronically Signed   By: Duanne Guess D.O.   On: 10/31/2020 11:43   DG Foot Complete Left  Result Date: 10/30/2020 CLINICAL DATA:  Bilateral toe infections.  Diabetic. EXAM: LEFT FOOT - COMPLETE 3+ VIEW COMPARISON:  None. FINDINGS: Soft tissue infection and/or bandage material about the distal third digit. No soft tissue gas. Hallux valgus deformity with degenerative changes of the first metatarsophalangeal joint. Diffuse mild forefoot and midfoot soft tissue swelling. Questionable cortical irregularity involving the distal third digit. No other osseous destruction identified. IMPRESSION: Soft tissue infection and/or bandage material about the distal third digit. Presuming there is bandage material, makes evaluation of osseous structures challenging. Cannot exclude cortical irregularity involving the tuft of the third digit. Consider MRI to exclude osteomyelitis. Electronically Signed   By: Jeronimo Greaves M.D.   On: 10/30/2020 18:19   DG Foot Complete Right  Result Date: 10/30/2020 CLINICAL DATA:  Question sepsis. non compliant  diabetic d/t being unaware he is diabetic. Open sore on right foot. Toes significant decay EXAM: RIGHT FOOT COMPLETE - 3+ VIEW COMPARISON:  None. FINDINGS: Markedly limited evaluation due to overlapping gauze overlying the first digit distal phalanx. Cortical erosion and destruction of the lateral first  digit distal phalanx. There is no evidence of fracture or dislocation. There is no evidence of severe arthropathy. Subcutaneus soft tissue edema of the first digit. IMPRESSION: Cortical erosion and destruction of the lateral first digit distal phalanx consistent with osteomyelitis. Electronically Signed   By: Tish Frederickson M.D.   On: 10/30/2020 18:26    Positive ROS: All other systems have been reviewed and were otherwise negative with the exception of those mentioned in the HPI and as above.  Physical Exam: General: Alert, no acute distress Cardiovascular: No pedal edema Respiratory: No cyanosis, no use of accessory musculature GI: No organomegaly, abdomen is soft and non-tender Skin: No lesions in the area of chief complaint Neurologic: Sensation intact distally Psychiatric: Patient is competent for consent with normal mood and affect Lymphatic: No axillary or cervical lymphadenopathy  MUSCULOSKELETAL: Examination of the right foot reveals wet gangrene with a necrotic right great toe.  He has foul-smelling purulent drainage from the toe.  Mild surrounding erythema.  He has a healing, dry superficial ulcer to the lateral aspect of the midfoot without surrounding erythema.  He has positive motor function dorsiflexion, plantar flexion, and great toe extension.  He has palpable pedal pulses.  He reports decreased sensation throughout the foot.  He has restricted range of motion of the ankle and knee.  The knee is benign without effusion or swelling.  Examination of the left foot reveals wet gangrene with a necrotic left third toe.  He has foul-smelling purulent drainage from the toe.  There is erythema extending into the dorsum of the foot.  He has positive motor function dorsiflexion, plantar flexion, and great toe extension.  He has palpable pedal pulses.  He reports decreased sensation throughout the foot.  He has restricted range of motion of the knee and ankle.  The knee is benign without  effusion or swelling.  Assessment: Diabetic foot ulcer right great toe with osteomyelitis and wet gangrene. Diabetic foot ulcer left third toe with osteomyelitis, wet gangrene, and cellulitis. Diabetic insensate neuropathy. Uncontrolled diabetes.  Plan: I discussed the findings with the patient.  He is going to require bilateral foot surgery, amputation of the right great toe and left third toe, possible partial ray resection.  ABIs are pending.  Continue IV antibiotics to improve the cellulitis.  Strict glucose control.  I have discussed the patient with my partner, Dr. Odis Hollingshead, who is a foot and ankle specialist and will take over care.  Further plan to follow.  Jonette Pesa, MD [phone]    10/31/2020 11:57 AM    ADDENDUM: Dr. Odis Hollingshead plans to see the patient tomorrow and take to OR around 12N. NPO after MN tonight for OR tomorrow.  2:55 PM

## 2020-11-01 ENCOUNTER — Encounter (HOSPITAL_COMMUNITY): Payer: Self-pay

## 2020-11-01 ENCOUNTER — Inpatient Hospital Stay (HOSPITAL_COMMUNITY): Payer: Self-pay | Admitting: Certified Registered"

## 2020-11-01 ENCOUNTER — Encounter (HOSPITAL_COMMUNITY)
Admission: EM | Disposition: A | Discharge status: Home / Self Care | Payer: Self-pay | Source: Home / Self Care | Attending: Family Medicine

## 2020-11-01 ENCOUNTER — Encounter (HOSPITAL_COMMUNITY): Payer: Self-pay | Admitting: Internal Medicine

## 2020-11-01 ENCOUNTER — Inpatient Hospital Stay (HOSPITAL_COMMUNITY): Payer: Self-pay

## 2020-11-01 HISTORY — PX: AMPUTATION: SHX166

## 2020-11-01 LAB — CBC WITH DIFFERENTIAL/PLATELET
Abs Immature Granulocytes: 0.11 10*3/uL — ABNORMAL HIGH (ref 0.00–0.07)
Basophils Absolute: 0 10*3/uL (ref 0.0–0.1)
Basophils Relative: 0 %
Eosinophils Absolute: 0.1 10*3/uL (ref 0.0–0.5)
Eosinophils Relative: 1 %
HCT: 31.5 % — ABNORMAL LOW (ref 39.0–52.0)
Hemoglobin: 10.7 g/dL — ABNORMAL LOW (ref 13.0–17.0)
Immature Granulocytes: 1 %
Lymphocytes Relative: 17 %
Lymphs Abs: 1.6 10*3/uL (ref 0.7–4.0)
MCH: 31.1 pg (ref 26.0–34.0)
MCHC: 34 g/dL (ref 30.0–36.0)
MCV: 91.6 fL (ref 80.0–100.0)
Monocytes Absolute: 0.6 10*3/uL (ref 0.1–1.0)
Monocytes Relative: 6 %
Neutro Abs: 7.3 10*3/uL (ref 1.7–7.7)
Neutrophils Relative %: 75 %
Platelets: 707 10*3/uL — ABNORMAL HIGH (ref 150–400)
RBC: 3.44 MIL/uL — ABNORMAL LOW (ref 4.22–5.81)
RDW: 12.2 % (ref 11.5–15.5)
WBC: 9.8 10*3/uL (ref 4.0–10.5)
nRBC: 0 % (ref 0.0–0.2)

## 2020-11-01 LAB — BASIC METABOLIC PANEL
Anion gap: 8 (ref 5–15)
BUN: 8 mg/dL (ref 8–23)
CO2: 22 mmol/L (ref 22–32)
Calcium: 8.1 mg/dL — ABNORMAL LOW (ref 8.9–10.3)
Chloride: 104 mmol/L (ref 98–111)
Creatinine, Ser: 0.6 mg/dL — ABNORMAL LOW (ref 0.61–1.24)
GFR, Estimated: >60 mL/min (ref >60)
Glucose, Bld: 235 mg/dL — ABNORMAL HIGH (ref 70–99)
Potassium: 4.1 mmol/L (ref 3.5–5.1)
Sodium: 134 mmol/L — ABNORMAL LOW (ref 135–145)

## 2020-11-01 LAB — GLUCOSE, CAPILLARY
Glucose-Capillary: 253 mg/dL — ABNORMAL HIGH (ref 70–99)
Glucose-Capillary: 257 mg/dL — ABNORMAL HIGH (ref 70–99)
Glucose-Capillary: 261 mg/dL — ABNORMAL HIGH (ref 70–99)
Glucose-Capillary: 227 mg/dL — ABNORMAL HIGH (ref 70–99)
Glucose-Capillary: 251 mg/dL — ABNORMAL HIGH (ref 70–99)

## 2020-11-01 LAB — SURGICAL PCR SCREEN
MRSA, PCR: NEGATIVE
Staphylococcus aureus: NEGATIVE

## 2020-11-01 SURGERY — AMPUTATION DIGIT
Anesthesia: General | Laterality: Bilateral

## 2020-11-01 MED ORDER — ADULT MULTIVITAMIN W/MINERALS CH
1.0000 | ORAL_TABLET | Freq: Every day | ORAL | Status: DC
  Administered 2020-11-01 – 2020-11-03 (×3): 1 via ORAL
  Filled 2020-11-01 (×3): qty 1

## 2020-11-01 MED ORDER — INSULIN GLARGINE-YFGN 100 UNIT/ML ~~LOC~~ SOLN
12.0000 [IU] | Freq: Every day | SUBCUTANEOUS | Status: DC
  Administered 2020-11-01: 12 [IU] via SUBCUTANEOUS
  Filled 2020-11-01 (×2): qty 0.12

## 2020-11-01 MED ORDER — LIDOCAINE 2% (20 MG/ML) 5 ML SYRINGE
INTRAMUSCULAR | Status: DC | PRN
  Administered 2020-11-01: 100 mg via INTRAVENOUS

## 2020-11-01 MED ORDER — MIDAZOLAM HCL 5 MG/5ML IJ SOLN
INTRAMUSCULAR | Status: DC | PRN
  Administered 2020-11-01: 2 mg via INTRAVENOUS

## 2020-11-01 MED ORDER — CEFAZOLIN SODIUM-DEXTROSE 2-3 GM-%(50ML) IV SOLR
INTRAVENOUS | Status: DC | PRN
  Administered 2020-11-01: 2 g via INTRAVENOUS

## 2020-11-01 MED ORDER — VANCOMYCIN HCL 1000 MG IV SOLR
INTRAVENOUS | Status: DC | PRN
  Administered 2020-11-01: 1000 mg via TOPICAL

## 2020-11-01 MED ORDER — CHLORHEXIDINE GLUCONATE 4 % EX LIQD
60.0000 mL | Freq: Once | CUTANEOUS | Status: AC
  Administered 2020-11-01: 4 via TOPICAL

## 2020-11-01 MED ORDER — ONDANSETRON HCL 4 MG/2ML IJ SOLN
INTRAMUSCULAR | Status: AC
  Filled 2020-11-01: qty 2

## 2020-11-01 MED ORDER — CHLORHEXIDINE GLUCONATE 0.12 % MT SOLN: 15.0000 mL | Freq: Once | OROMUCOSAL | Status: AC

## 2020-11-01 MED ORDER — PHENYLEPHRINE HCL-NACL 20-0.9 MG/250ML-% IV SOLN
INTRAVENOUS | Status: DC | PRN
  Administered 2020-11-01: 50 ug/min via INTRAVENOUS

## 2020-11-01 MED ORDER — AMISULPRIDE (ANTIEMETIC) 5 MG/2ML IV SOLN: 10.0000 mg | Freq: Once | INTRAVENOUS | Status: DC | PRN

## 2020-11-01 MED ORDER — FENTANYL CITRATE (PF) 250 MCG/5ML IJ SOLN
INTRAMUSCULAR | Status: AC
  Filled 2020-11-01: qty 5

## 2020-11-01 MED ORDER — SODIUM CHLORIDE 0.9 % IR SOLN
Status: DC | PRN
  Administered 2020-11-01 (×2): 3000 mL

## 2020-11-01 MED ORDER — OXYCODONE HCL 5 MG PO TABS: 5.0000 mg | ORAL_TABLET | ORAL | Status: DC | PRN

## 2020-11-01 MED ORDER — INSULIN ASPART 100 UNIT/ML IJ SOLN
INTRAMUSCULAR | Status: AC
  Filled 2020-11-01: qty 1

## 2020-11-01 MED ORDER — VANCOMYCIN HCL 1000 MG IV SOLR
INTRAVENOUS | Status: AC
  Filled 2020-11-01: qty 20

## 2020-11-01 MED ORDER — PROMETHAZINE HCL 25 MG/ML IJ SOLN: 6.2500 mg | INTRAMUSCULAR | Status: DC | PRN

## 2020-11-01 MED ORDER — ENSURE MAX PROTEIN PO LIQD
11.0000 [oz_av] | Freq: Two times a day (BID) | ORAL | Status: DC
  Administered 2020-11-01 – 2020-11-03 (×4): 11 [oz_av] via ORAL

## 2020-11-01 MED ORDER — OXYCODONE-ACETAMINOPHEN 5-325 MG PO TABS: 1.0000 | ORAL_TABLET | ORAL | Status: DC | PRN

## 2020-11-01 MED ORDER — BACITRACIN ZINC 500 UNIT/GM EX OINT
TOPICAL_OINTMENT | CUTANEOUS | Status: DC | PRN
  Administered 2020-11-01: 1 via TOPICAL

## 2020-11-01 MED ORDER — POVIDONE-IODINE 10 % EX SWAB: 2.0000 "application " | Freq: Once | CUTANEOUS | Status: DC

## 2020-11-01 MED ORDER — 0.9 % SODIUM CHLORIDE (POUR BTL) OPTIME
TOPICAL | Status: DC | PRN
  Administered 2020-11-01: 1000 mL

## 2020-11-01 MED ORDER — MIDAZOLAM HCL 2 MG/2ML IJ SOLN
INTRAMUSCULAR | Status: AC
  Filled 2020-11-01: qty 2

## 2020-11-01 MED ORDER — PROPOFOL 10 MG/ML IV BOLUS
INTRAVENOUS | Status: DC | PRN
  Administered 2020-11-01: 130 mg via INTRAVENOUS

## 2020-11-01 MED ORDER — LACTATED RINGERS IV SOLN: INTRAVENOUS | Status: DC

## 2020-11-01 MED ORDER — PHENYLEPHRINE 40 MCG/ML (10ML) SYRINGE FOR IV PUSH (FOR BLOOD PRESSURE SUPPORT)
PREFILLED_SYRINGE | INTRAVENOUS | Status: DC | PRN
  Administered 2020-11-01 (×2): 200 ug via INTRAVENOUS
  Administered 2020-11-01: 160 ug via INTRAVENOUS
  Administered 2020-11-01: 200 ug via INTRAVENOUS

## 2020-11-01 MED ORDER — PHENYLEPHRINE 40 MCG/ML (10ML) SYRINGE FOR IV PUSH (FOR BLOOD PRESSURE SUPPORT)
PREFILLED_SYRINGE | INTRAVENOUS | Status: AC
  Filled 2020-11-01: qty 20

## 2020-11-01 MED ORDER — PROPOFOL 10 MG/ML IV BOLUS
INTRAVENOUS | Status: AC
  Filled 2020-11-01: qty 20

## 2020-11-01 MED ORDER — BACITRACIN ZINC 500 UNIT/GM EX OINT
TOPICAL_OINTMENT | CUTANEOUS | Status: AC
  Filled 2020-11-01: qty 28.35

## 2020-11-01 MED ORDER — CHLORHEXIDINE GLUCONATE 0.12 % MT SOLN
OROMUCOSAL | Status: AC
  Administered 2020-11-01: 15 mL via OROMUCOSAL
  Filled 2020-11-01: qty 15

## 2020-11-01 MED ORDER — PROSOURCE PLUS PO LIQD
30.0000 mL | Freq: Two times a day (BID) | ORAL | Status: DC
  Administered 2020-11-01 – 2020-11-03 (×5): 30 mL via ORAL
  Filled 2020-11-01 (×5): qty 30

## 2020-11-01 MED ORDER — FENTANYL CITRATE (PF) 250 MCG/5ML IJ SOLN
INTRAMUSCULAR | Status: DC | PRN
  Administered 2020-11-01 (×5): 50 ug via INTRAVENOUS

## 2020-11-01 MED ORDER — FENTANYL CITRATE (PF) 100 MCG/2ML IJ SOLN: 25.0000 ug | INTRAMUSCULAR | Status: DC | PRN

## 2020-11-01 MED ORDER — EPHEDRINE SULFATE-NACL 50-0.9 MG/10ML-% IV SOSY
PREFILLED_SYRINGE | INTRAVENOUS | Status: DC | PRN
  Administered 2020-11-01: 10 mg via INTRAVENOUS

## 2020-11-01 MED ORDER — ONDANSETRON HCL 4 MG/2ML IJ SOLN
INTRAMUSCULAR | Status: DC | PRN
  Administered 2020-11-01: 4 mg via INTRAVENOUS

## 2020-11-01 SURGICAL SUPPLY — 42 items
BAG COUNTER SPONGE SURGICOUNT (BAG) ×2 IMPLANT
BLADE SURG 15 STRL LF DISP TIS (BLADE) IMPLANT
BLADE SURG 15 STRL SS (BLADE) ×2
BNDG COHESIVE 4X5 TAN ST LF (GAUZE/BANDAGES/DRESSINGS) ×1 IMPLANT
BNDG ELASTIC 4X5.8 VLCR STR LF (GAUZE/BANDAGES/DRESSINGS) ×2 IMPLANT
BNDG GAUZE ELAST 4 BULKY (GAUZE/BANDAGES/DRESSINGS) ×2 IMPLANT
CHLORAPREP W/TINT 26 (MISCELLANEOUS) ×4 IMPLANT
CNTNR URN SCR LID CUP LEK RST (MISCELLANEOUS) IMPLANT
CONT SPEC 4OZ STRL OR WHT (MISCELLANEOUS) ×2
COVER SURGICAL LIGHT HANDLE (MISCELLANEOUS) ×3 IMPLANT
CUFF TOURN SGL QUICK 34 (TOURNIQUET CUFF) ×2
CUFF TRNQT CYL 34X4X40X1 (TOURNIQUET CUFF) IMPLANT
DRAPE BILATERAL LIMB T (DRAPES) ×1 IMPLANT
DRAPE C-ARM MINI 42X72 WSTRAPS (DRAPES) ×1 IMPLANT
DRAPE IMP U-DRAPE 54X76 (DRAPES) ×2 IMPLANT
DRAPE U-SHAPE 47X51 STRL (DRAPES) ×3 IMPLANT
DRSG ADAPTIC 3X8 NADH LF (GAUZE/BANDAGES/DRESSINGS) ×1 IMPLANT
ELECT REM PT RETURN 9FT ADLT (ELECTROSURGICAL) ×2
ELECTRODE REM PT RTRN 9FT ADLT (ELECTROSURGICAL) ×1 IMPLANT
GAUZE 4X4 16PLY ~~LOC~~+RFID DBL (SPONGE) ×1 IMPLANT
GAUZE SPONGE 4X4 12PLY STRL (GAUZE/BANDAGES/DRESSINGS) ×1 IMPLANT
GLOVE SURG ORTHO LTX SZ9 (GLOVE) ×2 IMPLANT
GLOVE SURG UNDER POLY LF SZ9 (GLOVE) ×2 IMPLANT
GOWN STRL REUS W/ TWL XL LVL3 (GOWN DISPOSABLE) ×2 IMPLANT
GOWN STRL REUS W/TWL XL LVL3 (GOWN DISPOSABLE) ×2
HANDPIECE INTERPULSE COAX TIP (DISPOSABLE) ×1
KIT BASIN OR (CUSTOM PROCEDURE TRAY) ×2 IMPLANT
KIT TURNOVER KIT B (KITS) ×2 IMPLANT
MANIFOLD NEPTUNE II (INSTRUMENTS) ×2 IMPLANT
MARKER SKIN DUAL TIP RULER LAB (MISCELLANEOUS) ×1 IMPLANT
NS IRRIG 1000ML POUR BTL (IV SOLUTION) ×2 IMPLANT
PACK ORTHO EXTREMITY (CUSTOM PROCEDURE TRAY) ×2 IMPLANT
PAD ABD 8X10 STRL (GAUZE/BANDAGES/DRESSINGS) ×2 IMPLANT
PAD ARMBOARD 7.5X6 YLW CONV (MISCELLANEOUS) ×3 IMPLANT
PADDING CAST COTTON 6X4 STRL (CAST SUPPLIES) ×1 IMPLANT
SET HNDPC FAN SPRY TIP SCT (DISPOSABLE) IMPLANT
SPONGE T-LAP 18X18 ~~LOC~~+RFID (SPONGE) ×1 IMPLANT
STOCKINETTE 4X48 STRL (DRAPES) ×1 IMPLANT
SUT ETHILON 3 0 FSL (SUTURE) ×3 IMPLANT
TOWEL GREEN STERILE (TOWEL DISPOSABLE) ×2 IMPLANT
TUBE CONNECTING 12X1/4 (SUCTIONS) ×1 IMPLANT
YANKAUER SUCT BULB TIP NO VENT (SUCTIONS) ×1 IMPLANT

## 2020-11-01 NOTE — Transfer of Care (Signed)
Immediate Anesthesia Transfer of Care Note  Patient: CASTER FAYETTE  Procedure(s) Performed: AMPUTATION 3rd LEFT TOE AND RIGHT GREAT  TOE (Bilateral)  Patient Location: PACU  Anesthesia Type:General  Level of Consciousness: drowsy and patient cooperative  Airway & Oxygen Therapy: Patient Spontanous Breathing  Post-op Assessment: Report given to RN and Post -op Vital signs reviewed and stable  Post vital signs: Reviewed and stable  Last Vitals:  Vitals Value Taken Time  BP 134/75 11/01/20 0958  Temp    Pulse 78 11/01/20 0958  Resp    SpO2 100 % 11/01/20 0958  Vitals shown include unvalidated device data.  Last Pain:  Vitals:   11/01/20 0750  TempSrc: Oral  PainSc:          Complications: No notable events documented.

## 2020-11-01 NOTE — Progress Notes (Signed)
Pharmacy Antibiotic Note  Kenneth Richards is a 60 y.o. male admitted on 10/30/2020 presenting with foot infection, DFI and concern for sepsis.  Pharmacy has been consulted for vancomycin dosing.  Ceftriaxone per MD  S/p Amputation of 3rd left toe and R great toe this am Scr improving at 0.6 from 1.05 Plan: Change to Vancomycin 1000 mg IV q 12h (eAUC 445, Goal AUC 400-550, SCr 0.8) Monitor renal function, Cx and clinical progression to narrow Vancomycin levels as needed  Height: 6' (182.9 cm) Weight: 72.6 kg (160 lb) IBW/kg (Calculated) : 77.6  Temp (24hrs), Avg:97.9 F (36.6 C), Min:97.4 F (36.3 C), Max:98.7 F (37.1 C)  Recent Labs  Lab 10/30/20 1617 10/30/20 1730 10/30/20 2031 10/31/20 0315 11/01/20 0224  WBC  --  15.1*  --  13.3* 9.8  CREATININE  --  1.05  --  0.77 0.60*  LATICACIDVEN 1.9  --  2.1*  --   --      Estimated Creatinine Clearance: 98.3 mL/min (A) (by C-G formula based on SCr of 0.6 mg/dL (L)).    No Known Allergies  Shirlee More, PharmD PGY2 Infectious Diseases Pharmacy Resident   Please check [email or web address] for unit-specific pharmacy phone numbers

## 2020-11-01 NOTE — Op Note (Addendum)
11/01/2020  10:03 AM   PATIENT: Kenneth Richards  60 y.o. male  MRN: [id number]   PRE-OPERATIVE DIAGNOSIS:   Diabetic Ulcers    POST-OPERATIVE DIAGNOSIS:   Diabetic Ulcers    PROCEDURE: Procedure(s): Amputation of the left 3rd toe at the level of metatarsophalangeal joint Amputation of the right great toe at the level of the metatarsophalangeal joint   SURGEON:  Netta Cedars, MD   ASSISTANT: None   ANESTHESIA: General, regional   EBL: Minimal   TOURNIQUET:    Total Tourniquet Time Documented: Thigh (Left) - 19 minutes Total: Thigh (Left) - 19 minutes  Thigh (Right) - 20 minutes Total: Thigh (Right) - 20 minutes    COMPLICATIONS: None apparent   DISPOSITION: Extubated, awake and stable to recovery.   INDICATION FOR PROCEDURE: [name] is a 60 y.o. male who complains of bilateral diabetic foot wounds present for a few weeks.  Patient went to his primary care doctor last week and was then referred to the ER.  He lives alone.  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  Patient states that he has not been on medication for his diabetes recently.  He reports chronic sensory changes to his feet.   He was found to have wet gangrene of the right great toe and left third toe with foul-smelling purulent discharge.  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  He was given IV antibiotics and admitted by the hospitalist - stable throughout his hospital stay.  Orthopedic consultation was placed for management of his bilateral foot wounds.  We discussed the diagnosis, alternative treatment options, risks and benefits of the above surgical intervention, as well as alternative non-operative treatments. All questions/concerns were addressed and the patient/family demonstrated  appropriate understanding of the diagnosis, the procedure, the postoperative course, and overall prognosis. The patient wished to proceed with surgical intervention and signed an informed surgical consent as such, in each others presence prior to surgery.   PROCEDURE IN DETAIL: After preoperative consent was obtained and the correct operative site was identified, the patient was brought to the operating room supine on stretcher and transferred onto operating table.  General anesthesia was induced. Preoperative antibiotics were administered. Surgical timeout was taken. The patient was then positioned supine. Both lower extremities were prepped and draped in standard sterile fashion with a tourniquet around each thigh.   The left lower extremity was first exsanguinated by gravity and the tourniquet was inflated to 250 mmHg.  A racket incision was made directly over the left 3rd metatarsophalangeal joint. Dissection was carried down to the level of the proximal phalanx with frank purulence noted. Surrounding soft tissues were sequentially released and the left 3rd toe removed. This was sent as specimen. We then irrigated this site with 3 L of normal saline followed by application of vancomycin powder. Tourniquet was deflated and hemostasis achieved. Excellent bleeding was noted at the skin edges. The skin was closed without tension using 3-0 nylon suture.  The right lower extremity was then exsanguinated by gravity and the tourniquet was inflated to 250 mmHg.  A racket incision was made directly over the right 1st metatarsophalangeal joint. Dissection was carried down to the level of the proximal phalanx of the great toe with frank purulence noted. Surrounding soft tissues were sequentially released and the  right great toe removed. This was also sent as specimen. As we did for the contralateral side, we then irrigated this site with 3 L of normal saline followed by application of vancomycin powder.  Tourniquet was deflated and hemostasis achieved. Excellent bleeding was noted at the skin edges. The skin was closed without tension using 3-0 nylon suture.   We used intraoperative fluoroscopy to obtain new baseline imaging of each forefoot following amputation.   Each leg was cleaned with saline and sterile adaptic-bacitracin dressings with gauze were applied along with ace wraps. The patient was awakened from anesthesia and transported to the recovery room in stable condition.    FOLLOW UP PLAN: -transfer to PACU, then back to RNF -IV abx per ID/Internal Medicine team -wound care consult for right lateral ankle wound -strict elevation of bilateral lower extremities -weightbearing through heel only in both lower extremities while wearing postop shoe, maximum elevation -pain meds per primary team -DVT Ppx: Aspirin 325 mg once daily x 30 days (or otherwise as desired by primary team) -physical therapy to assist with crutch training -sutures out in 3 weeks in outpatient office   RADIOGRAPHS: AP radiographs of both operative extremities were obtained intraoperatively.  These showed interval amputations of the left 3rd and right 1st toes at the level of their respective MTP joints.  Netta Cedars [facility]

## 2020-11-01 NOTE — Consult Note (Addendum)
WOC Nurse Consult Note: Patient receiving care in [facility] [phone] Reason for Consult:right lateral ankle wound Wound type: Full thickness right lateral ankle wound.  Pressure Injury POA: NA Wound bed: 40% yellow/white, 60% pale pink/red with dry flaky edges Drainage (amount, consistency, odor)  Periwound: dry Dressing procedure/placement/frequency: Clean the wound with NS then apply a cut to fit piece of Aquacel Advantage [employer] [place] # [id number]) over the wound and secure with a foam dressing. Carefully peel back the foam dressing daily and change the Aquacel. May use the foam dressing up to 3 days unless soiled.  PHOTO TAKEN FROM MEDIA. NOT TAKEN BY WOC TEAM    Monitor the wound area(s) for worsening of condition such as: Signs/symptoms of infection, increase in size, development of or worsening of odor, development of pain, or increased pain at the affected locations.   Notify the medical team if any of these develop.  Thank you for the consult. WOC nurse will not follow at this time.   Please re-consult the WOC team if needed.  Renaldo Reel Katrinka Blazing, MSN, RN, CMSRN, Angus Seller, [facility] Wound Treatment Associate Pager [phone]

## 2020-11-01 NOTE — Anesthesia Preprocedure Evaluation (Addendum)
Anesthesia Evaluation  Patient identified by MRN, date of birth, ID band Patient awake    Reviewed: Allergy & Precautions, NPO status , Patient's Chart, lab work & pertinent test results  History of Anesthesia Complications Negative for: history of anesthetic complications  Airway Mallampati: I  TM Distance: >3 FB Neck ROM: Full    Dental  (+) Poor Dentition, Missing, Loose, Dental Advisory Given   Pulmonary neg pulmonary ROS,    Pulmonary exam normal        Cardiovascular + DVT  Normal cardiovascular exam     Neuro/Psych negative neurological ROS     GI/Hepatic negative GI ROS, Neg liver ROS,   Endo/Other  diabetes  Renal/GU negative Renal ROS     Musculoskeletal negative musculoskeletal ROS (+)   Abdominal   Peds  Hematology negative hematology ROS (+)   Anesthesia Other Findings   Reproductive/Obstetrics                            Anesthesia Physical Anesthesia Plan  ASA: 3  Anesthesia Plan: General   Post-op Pain Management:    Induction: Intravenous  PONV Risk Score and Plan: 2 and Ondansetron  Airway Management Planned: LMA  Additional Equipment:   Intra-op Plan:   Post-operative Plan: Extubation in OR  Informed Consent: I have reviewed the patients History and Physical, chart, labs and discussed the procedure including the risks, benefits and alternatives for the proposed anesthesia with the patient or authorized representative who has indicated his/her understanding and acceptance.     Dental advisory given  Plan Discussed with: Anesthesiologist and Surgeon  Anesthesia Plan Comments:        Anesthesia Quick Evaluation

## 2020-11-01 NOTE — Anesthesia Postprocedure Evaluation (Signed)
Anesthesia Post Note  Patient: Kenneth Richards  Procedure(s) Performed: AMPUTATION 3rd LEFT TOE AND RIGHT GREAT  TOE (Bilateral)     Patient location during evaluation: PACU Anesthesia Type: General Level of consciousness: sedated Pain management: pain level controlled Vital Signs Assessment: post-procedure vital signs reviewed and stable Respiratory status: spontaneous breathing and respiratory function stable Cardiovascular status: stable Postop Assessment: no apparent nausea or vomiting Anesthetic complications: no   No notable events documented.  Last Vitals:  Vitals:   11/01/20 1015 11/01/20 1028  BP: 113/72 115/71  Pulse: 74 72  Resp: 16 18  Temp:  (!) 36.4 C  SpO2: 98% 98%    Last Pain:  Vitals:   11/01/20 1028  TempSrc:   PainSc: 0-No pain                 Bret Vanessen DANIEL

## 2020-11-01 NOTE — Brief Op Note (Signed)
11/01/2020  10:01 AM  PATIENT:  Kenneth Richards  60 y.o. male  PRE-OPERATIVE DIAGNOSIS:  Diabetic Ulcers   POST-OPERATIVE DIAGNOSIS:  Diabetic Ulcers   PROCEDURE:  Procedure(s): AMPUTATION 3rd LEFT TOE AND RIGHT GREAT  TOE (Bilateral) - at level of MTP joints  SURGEON:  Surgeon(s) and Role:    Netta Cedars, MD - Primary  PHYSICIAN ASSISTANT: None  ASSISTANTS: none   ANESTHESIA:   general  EBL:  10 mL   BLOOD ADMINISTERED:none  DRAINS: none   LOCAL MEDICATIONS USED:  NONE  SPECIMEN:  Source of Specimen:  Left 3rd toe, Right great toe  DISPOSITION OF SPECIMEN:   Path and Micro - Gross specimens  COUNTS:  YES  TOURNIQUET:   Total Tourniquet Time Documented: Thigh (Left) - 19 minutes Total: Thigh (Left) - 19 minutes  Thigh (Right) - 20 minutes Total: Thigh (Right) - 20 minutes   DICTATION: .Note written in EPIC  PLAN OF CARE:  Return to [facility] under Hospitalist team as prior  PATIENT DISPOSITION:  PACU - hemodynamically stable.   Delay start of Pharmacological VTE agent (>24hrs) due to surgical blood loss or risk of bleeding: yes

## 2020-11-01 NOTE — Progress Notes (Addendum)
Initial Nutrition Assessment  DOCUMENTATION CODES:   Not applicable  INTERVENTION:  -Ensure Max po BID, each supplement provides 150 kcal and 30 grams of protein -PROSource PLUS PO BID, each supplement provides 100 kcals and 15 grams of protein -MVI with minerals daily  NUTRITION DIAGNOSIS:   Increased nutrient needs related to wound healing as evidenced by estimated needs.  GOAL:   Patient will meet greater than or equal to 90% of their needs  MONITOR:   PO intake, Supplement acceptance, Labs, Weight trends, Skin, I & O's  REASON FOR ASSESSMENT:   Consult Wound healing  ASSESSMENT:   Pt with PMH significant for type 2 DM admitted with sepsis 2/2 possible gangrene and underlying osteomyelitis of toe L foot  Pt unavailable at time of RD visit. Pt undergoing amputation of L 3rd toe and R great toe.   No weight history available for review.   No PO Intake documented.   UOP: x24 hours I/O: -57ml since admit  Medications: SSI TID w/ meals and at bedtime, IV abx Labs: Recent Labs  Lab 10/30/20 1730 10/31/20 0315 11/01/20 0224  NA 129* 131* 134*  K 4.4 3.9 4.1  CL 95* 101 104  CO2 21* 22 22  BUN 15 11 8   CREATININE 1.05 0.77 0.60*  CALCIUM 9.0 8.2* 8.1*  GLUCOSE 261* 238* 235*  CBGs: 114-261 x24 hours   NUTRITION - FOCUSED PHYSICAL EXAM: Unable to perform at this time. Will attempt at follow-up.   Diet Order:   Diet Order             Diet Carb Modified Fluid consistency: Thin; Room service appropriate? Yes  Diet effective now                   EDUCATION NEEDS:   No education needs have been identified at this time  Skin:  Skin Assessment: Skin Integrity Issues: Skin Integrity Issues:: Diabetic Ulcer, Other (Comment), Incisions Diabetic Ulcer: L foot, L foot toes, R foot toes Incisions: foot Other: laceration R foot  Last BM:  10/13  Height:   Ht Readings from Last 1 Encounters:  10/30/20 6' (1.829 m)    Weight:   Wt  Readings from Last 1 Encounters:  10/30/20 72.6 kg   BMI:  Body mass index is 21.7 kg/m.  Estimated Nutritional Needs:   Kcal:  2150-2350  Protein:  105-120 grams  Fluid:  >2L/d    12-25-1989, MS, RD, LDN (she/her/hers) RD pager number and weekend/on-call pager number located in Amion.

## 2020-11-01 NOTE — H&P (Signed)
H&P documentation:   -History and Physical Reviewed  -Patient has been re-examined  -No change in the plan of care  [facility]

## 2020-11-01 NOTE — Progress Notes (Signed)
PROGRESS NOTE   Kenneth Richards  VOH:[id number] DOB: [birth year] DOA: 10/30/2020 PCP: Pcp, No  Brief Narrative:  60 year old male diet-controlled diabetes mellitus not on any meds,devleoped cellulitis and swlling of toes Has not seen PCP in ~5-7 y Septic on admit T-max 100.1 white count 15   Hospital-Problem based course  Sepsis secondary to possible gangrene underlying osteo of toes bilateralls S/p amputation of digits Dr. Odis Hollingshead 10.16  Continue ceftriaxone Flagyl vancomycin broad-spectrum  for now and narrow off 24 hr post op Add oxycodone/Oxy IR for post op pain control Saline 75 cc/H DM TY 2 previously diet-controlled A1c this admit 10.3 Will need insulin dosing will need teaching --have placed care order for him to learn to use insulin etc etc start Semglee 12 units for long acting coverage post op Cont SSI and add meal time insulin post op Continue mod SSI--CBGs ranging 230 range  DVT prophylaxis: lovenox Code Status: f Family Communication: none Disposition:  Status is: Inpatient  Remains inpatient appropriate because: surgery       Consultants:  ortho  Procedures:   Antimicrobials:     Subjective: No pain Back from surgery   Objective: Vitals:   11/01/20 1000 11/01/20 1015 11/01/20 1028 11/01/20 1052  BP: 134/75 113/72 115/71 106/69  Pulse: 80 74 72 69  Resp: 16 16 18    Temp: 97.9 F (36.6 C)  (!) 97.5 F (36.4 C) (!) 97.4 F (36.3 C)  TempSrc:    Oral  SpO2: 96% 98% 98% 99%  Weight:      Height:        Intake/Output Summary (Last 24 hours) at 11/01/2020 1441 Last data filed at 11/01/2020 1200 Gross per 24 hour  Intake 2881.85 ml  Output 1960 ml  Net 921.85 ml    Filed Weights   10/30/20 1641  Weight: 72.6 kg    Examination:  coherent in and no focal deficit no rales rhonci or wheeze Abd soft nt nd no rebound Wounds in re-inforced dressings bilaterally Neuro intact no focal deficit S1 s2 no m/r/g  Data Reviewed:  personally reviewed   CBC    Component Value Date/Time   WBC 9.8 11/01/2020 0224   RBC 3.44 (L) 11/01/2020 0224   HGB 10.7 (L) 11/01/2020 0224   HCT 31.5 (L) 11/01/2020 0224   PLT 707 (H) 11/01/2020 0224   MCV 91.6 11/01/2020 0224   MCH 31.1 11/01/2020 0224   MCHC 34.0 11/01/2020 0224   RDW 12.2 11/01/2020 0224   LYMPHSABS 1.6 11/01/2020 0224   MONOABS 0.6 11/01/2020 0224   EOSABS 0.1 11/01/2020 0224   BASOSABS 0.0 11/01/2020 0224   CMP Latest Ref Rng & Units 11/01/2020 10/31/2020 10/30/2020  Glucose 70 - 99 mg/dL 11/01/2020) 694(W) 546(E)  BUN 8 - 23 mg/dL 8 11 15   Creatinine 0.61 - 1.24 mg/dL 703(J) 0.09(F  Sodium 135 - 145 mmol/L 134(L) 131(L) 129(L)  Potassium 3.5 - 5.1 mmol/L 4.1 3.9 4.4  Chloride 98 - 111 mmol/L 104 101 95(L)  CO2 22 - 32 mmol/L 22 22 21(L)  Calcium 8.9 - 10.3 mg/dL 8.1(L) 8.2(L) 9.0  Total Protein 6.5 - 8.1 g/dL - 6.6 8.18)  Total Bilirubin 0.3 - 1.2 mg/dL - 0.5 0.5  Alkaline Phos 38 - 126 U/L - 88 109  AST 15 - 41 U/L - 22 51(H)  ALT 0 - 44 U/L - 26 34     Radiology Studies: DG Chest 1 View  Result Date: 10/30/2020 CLINICAL DATA:  Questionable sepsis  EXAM: CHEST  1 VIEW COMPARISON:  Stent FINDINGS: The heart and mediastinal contours are within normal limits. No focal consolidation. No pulmonary edema. No pleural effusion. No pneumothorax. No acute osseous abnormality. IMPRESSION: No active disease. Electronically Signed   By: Tish Frederickson M.D.   On: 10/30/2020 18:14   MR FOOT RIGHT W WO CONTRAST  Result Date: 10/31/2020 CLINICAL DATA:  Diabetic foot wound at the right great toe EXAM: MRI OF THE RIGHT FOREFOOT WITHOUT AND WITH CONTRAST TECHNIQUE: Multiplanar, multisequence MR imaging of the right forefoot was performed before and after the administration of intravenous contrast. CONTRAST:  7.45mL GADAVIST GADOBUTROL 1 MMOL/ML IV SOLN COMPARISON:  X-ray 10/30/2020 FINDINGS: Bones/Joint/Cartilage Cortical erosion involving the distal phalanx of the  right great toe with marked bone marrow edema, enhancement, and confluent low T1 signal changes compatible with acute osteomyelitis. There is a ulceration along the plantar surface of the great toe distally with peripherally enhancing ulcer base surrounding the distal phalanx of the great toe. Collection, contiguous with the ulcer base, measures approximately 2.3 x 1.7 x 2.2 cm (series 5, image 19). Preserved bone marrow signal within the proximal phalanx of the great toe. No additional sites of bony destruction or marrow replacement. No acute fracture. 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 Ligaments Intact Lisfranc ligament.  No evidence of acute ligamentous injury. Muscles and Tendons Denervation changes of the foot musculature. No significant tenosynovial fluid collection. Soft tissues Great toe ulcer with associated collection and surrounding soft tissue edema/cellulitis. No additional fluid collections or ulcerations. IMPRESSION: 1. Acute osteomyelitis of the distal phalanx of the right great toe. 2. Ulceration underlying the plantar aspect of the right great toe with contiguous 2.2 cm peripherally-enhancing abscess. 3. 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 Electronically Signed   By: Duanne Guess D.O.   On: 10/31/2020 11:38   MR FOOT LEFT W WO CONTRAST  Result Date: 10/31/2020 CLINICAL DATA:  Diabetic foot wound involving the left third toe EXAM: MRI OF THE LEFT FOREFOOT WITHOUT AND WITH CONTRAST TECHNIQUE: Multiplanar, multisequence MR imaging of the left foot was performed both before and after administration of intravenous contrast. CONTRAST:  7.51mL GADAVIST GADOBUTROL 1 MMOL/ML IV SOLN COMPARISON:  X-ray 10/30/2020 FINDINGS: Bones/Joint/Cartilage Bony destruction involving the distal phalanx of the left third toe with associated confluent low T1 marrow signal compatible with acute osteomyelitis. Mild bone marrow edema within the middle phalanx of the  third toe without definite bony destruction or marrow replacement. Remaining osseous structures of the left foot are intact. No fracture. No dislocation. No additional sites of bone destruction or marrow replacement. 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 Ligaments Intact Lisfranc ligament. No evidence of acute ligamentous injury of the foot. Muscles and Tendons Denervation changes of the intrinsic foot musculature. No significant tenosynovial fluid collection. Soft tissues Ulceration involving the distal aspect of the third toe with soft tissue swelling and edema. No organized or rim enhancing fluid collection. Small volume third intermetatarsal space bursitis. IMPRESSION: 1. 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 2. Ulceration involving the distal aspect of the left third toe with associated cellulitis. No abscess. 3. Mild bone marrow edema within the middle phalanx of the third toe without definite bony destruction or marrow replacement, suggesting reactive osteitis. 4. Small volume third intermetatarsal space bursitis. 5. 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 Electronically Signed   By: Duanne Guess D.O.   On: 10/31/2020 11:43  DG Foot Complete Left  Result Date: 10/30/2020 CLINICAL DATA:  Bilateral toe infections.  Diabetic. EXAM: LEFT FOOT - COMPLETE 3+ VIEW COMPARISON:  None. FINDINGS: Soft tissue infection and/or bandage material about the distal third digit. No soft tissue gas. Hallux valgus deformity with degenerative changes of the first metatarsophalangeal joint. Diffuse mild forefoot and midfoot soft tissue swelling. Questionable cortical irregularity involving the distal third digit. No other osseous destruction identified. IMPRESSION: Soft tissue infection and/or bandage material about the distal third digit. Presuming there is bandage material, makes evaluation of osseous structures challenging. Cannot exclude  cortical irregularity involving the tuft of the third digit. Consider MRI to exclude osteomyelitis. Electronically Signed   By: Jeronimo Greaves M.D.   On: 10/30/2020 18:19   DG Foot Complete Right  Result Date: 10/30/2020 CLINICAL DATA:  Question sepsis. non compliant diabetic d/t being unaware he is diabetic. Open sore on right foot. Toes significant decay EXAM: RIGHT FOOT COMPLETE - 3+ VIEW COMPARISON:  None. FINDINGS: Markedly limited evaluation due to overlapping gauze overlying the first digit distal phalanx. Cortical erosion and destruction of the lateral first digit distal phalanx. There is no evidence of fracture or dislocation. There is no evidence of severe arthropathy. Subcutaneus soft tissue edema of the first digit. IMPRESSION: Cortical erosion and destruction of the lateral first digit distal phalanx consistent with osteomyelitis. Electronically Signed   By: Tish Frederickson M.D.   On: 10/30/2020 18:26   DG MINI C-ARM IMAGE ONLY  Result Date: 11/01/2020 There is no interpretation for this exam.  This order is for images obtained during a surgical procedure.  Please See "Surgeries" Tab for more information regarding the procedure.     Scheduled Meds:  (feeding supplement) PROSource Plus  30 mL Oral BID BM   COVID-19 mRNA bivalent vaccine (Pfizer)  0.3 mL Intramuscular ONCE-1600   insulin aspart       insulin aspart  0-15 Units Subcutaneous TID WC   insulin aspart  0-5 Units Subcutaneous QHS   multivitamin with minerals  1 tablet Oral Daily   Ensure Max Protein  11 oz Oral BID   Continuous Infusions:  sodium chloride 75 mL/hr at 11/01/20 1104   cefTRIAXone (ROCEPHIN)  IV 2 g (11/01/20 1112)   metronidazole 500 mg (11/01/20 1109)   vancomycin 750 mg (11/01/20 1216)     LOS: 2 days   Time spent: 15  Rhetta Mura, MD [facility] To contact the attending provider between 7A-7P or the covering provider during after hours 7P-7A, please log into the web site  [email or web address] and access using universal [place] password for that web site. If you do not have the password, please call the hospital operator.  11/01/2020, 2:41 PM

## 2020-11-01 NOTE — Anesthesia Procedure Notes (Signed)
Procedure Name: LMA Insertion Date/Time: 11/01/2020 8:12 AM Performed by: Rosiland Oz, CRNA Pre-anesthesia Checklist: Patient identified, Emergency Drugs available, Suction available, Patient being monitored and Timeout performed Patient Re-evaluated:Patient Re-evaluated prior to induction Oxygen Delivery Method: Circle system utilized Preoxygenation: Pre-oxygenation with 100% oxygen Induction Type: IV induction LMA: LMA inserted LMA Size: 5.0 Number of attempts: 1 Placement Confirmation: positive ETCO2 and breath sounds checked- equal and bilateral Tube secured with: Tape Dental Injury: Teeth and Oropharynx as per pre-operative assessment

## 2020-11-02 ENCOUNTER — Encounter (HOSPITAL_COMMUNITY): Payer: Self-pay | Admitting: Orthopaedic Surgery

## 2020-11-02 LAB — GLUCOSE, CAPILLARY
Glucose-Capillary: 172 mg/dL — ABNORMAL HIGH (ref 70–99)
Glucose-Capillary: 223 mg/dL — ABNORMAL HIGH (ref 70–99)
Glucose-Capillary: 140 mg/dL — ABNORMAL HIGH (ref 70–99)
Glucose-Capillary: 156 mg/dL — ABNORMAL HIGH (ref 70–99)

## 2020-11-02 MED ORDER — INSULIN GLARGINE-YFGN 100 UNIT/ML ~~LOC~~ SOLN
16.0000 [IU] | Freq: Every day | SUBCUTANEOUS | Status: DC
  Administered 2020-11-02: 16 [IU] via SUBCUTANEOUS
  Filled 2020-11-02: qty 0.16

## 2020-11-02 MED ORDER — LIVING WELL WITH DIABETES BOOK
Freq: Once | Status: AC
  Filled 2020-11-02: qty 1

## 2020-11-02 MED ORDER — METFORMIN HCL 500 MG PO TABS
500.0000 mg | ORAL_TABLET | Freq: Every day | ORAL | Status: DC
  Administered 2020-11-03: 500 mg via ORAL
  Filled 2020-11-02: qty 1

## 2020-11-02 MED ORDER — INSULIN ASPART PROT & ASPART (70-30 MIX) 100 UNIT/ML ~~LOC~~ SUSP
12.0000 [IU] | Freq: Two times a day (BID) | SUBCUTANEOUS | Status: DC
  Administered 2020-11-02 – 2020-11-03 (×2): 12 [IU] via SUBCUTANEOUS
  Filled 2020-11-02 (×2): qty 10

## 2020-11-02 MED ORDER — INSULIN STARTER KIT- PEN NEEDLES (ENGLISH)
1.0000 | Freq: Once | Status: AC
  Administered 2020-11-02: 1
  Filled 2020-11-02: qty 1

## 2020-11-02 NOTE — Progress Notes (Signed)
Inpatient Diabetes Program Recommendations  AACE/ADA: New Consensus Statement on Inpatient Glycemic Control (2015)  Target Ranges:  Prepandial:   less than 140 mg/dL      Peak postprandial:   less than 180 mg/dL (1-2 hours)      Critically ill patients:  140 - 180 mg/dL   Lab Results  Component Value Date   GLUCAP 223 (H) 11/02/2020   HGBA1C 10.3 (H) 10/30/2020    Review of Glycemic Control Results for CAMMERON, GREIS (MRN [id number]) as of 11/02/2020 15:27  Ref. Range 11/01/2020 11:28 11/01/2020 21:08 11/02/2020 07:42 11/02/2020 11:35  Glucose-Capillary Latest Ref Range: 70 - 99 mg/dL 227 (H) 251 (H) 172 (H) 223 (H)   Diabetes history: Type 2 DM Outpatient Diabetes medications: none Current orders for Inpatient glycemic control: Novolog 0-15 units TID & HS, Semglee 16 units QD  Inpatient Diabetes Program Recommendations:    Spoke with patient regarding outpatient diabetes management. Has not seen a PCP in the last 7 years since moving here. His previous PCP had discontinued Metformin and checking CBGs.  Reviewed patient's current A1c of 10.3%. Explained what a A1c is and what it measures. Also reviewed goal A1c with patient, importance of good glucose control @ home, and blood sugar goals. Reviewed patho of DM, need for insulin, role of pancreas, impact of infection, hyper vs hypo glycemia and associated interventions, 70/30 & when to take/cost, vascular changes and commorbidities.  Patient will need a meter. Will provide to patient. Reviewed frequency for checking CBGs and when to call Md.  Admits to drinking sugary beverages including juices and sodas, however, now that he is aware of his glucose he has been working to drink alternatives. Reviewed plate method, CHO mindfulness and importance of eliminating sugary beverages. Dietitian consult placed. Educated patient on insulin pen use at home. Reviewed contents of insulin flexpen starter kit. Reviewed all steps if insulin pen including  attachment of needle, 2-unit air shot, dialing up dose, giving injection, removing needle, disposal of sharps, storage of unused insulin, disposal of insulin etc. Patient able to provide successful return demonstration. Also reviewed troubleshooting with insulin pen. MD to give patient Rxs for insulin pens and insulin pen needles.  Thanks, Bronson Curb, MSN, RNC-OB Diabetes Coordinator [phone] (8a-5p)

## 2020-11-02 NOTE — Progress Notes (Addendum)
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 Postop shoes obtained. Patient feels well overall and is grateful for multidisciplinary care. He is mindful of need for improved glycemic control as well as proper nutrition.  Objective:   VITALS:  Temp:  [97.8 F (36.6 C)-98.2 F (36.8 C)] 98.2 F (36.8 C) (10/17 1435) Pulse Rate:  [72-74] 74 (10/17 1435) Resp:  [18-20] 18 (10/17 1435) BP: (102-110)/(60-72) 102/66 (10/17 1435) SpO2:  [98 %-100 %] 100 % (10/17 1435)  PHYSICAL EXAM  General: Well nourished, well developed. Awake, alert and oriented to time, place, person. Normal mood and affect. No apparent distress. Breathing room air.  Bilateral lower extremities: Dressings in place, dry and intact 5/5 motor strength Diffuse decreased sensation but intact to light touch DP, PT 2+ CR<2s   LABS Recent Labs    10/31/20 0315 11/01/20 0224  HGB 11.1* 10.7*  WBC 13.3* 9.8  PLT 765* 707*   Recent Labs    10/31/20 0315 11/01/20 0224  NA 131* 134*  K 3.9 4.1  CL 101 104  CO2 22 22  BUN 11 8  CREATININE 0.77 0.60*  GLUCOSE 238* 235*   No results for input(s): LABPT, INR in the last 72 hours.   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

## 2020-11-02 NOTE — Progress Notes (Signed)
Orthopedic Tech Progress Note Patient Details:  Kenneth Richards 09-Jul-1960 [id number]  Ortho Devices Type of Ortho Device: Darco shoe Ortho Device/Splint Location: BLE Ortho Device/Splint Interventions: Ordered, Application   Post Interventions Patient Tolerated: Well Instructions Provided: Care of device  Donald Pore 11/02/2020, 1:02 PM

## 2020-11-02 NOTE — Progress Notes (Signed)
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 Odis Hollingshead 10.16  Initially placed on ceftriaxone Flagyl vancomycin--as source control obtained, d/c abx today and observe fever curve etc Add oxycodone/Oxy IR for post op pain control Saline 75 cc/H saline locked 10/17 DM TY 2 previously diet-controlled A1c this admit 10.3 Will need insulin dosing --also no insurance Will change to flexpen 70/30 bid 12 u and adjust based on SSI--start metformin 500 qd for 2 days then increase to BID Continue mod SSI--CBGs ranging 230 range  DVT prophylaxis: lovenox Code Status: f Family Communication: none Disposition:  Status is: Inpatient  Remains inpatient appropriate because: surgery       Consultants:  ortho  Procedures:   Antimicrobials:     Subjective: No pain Cleared by PT for home but will be working on therapy with boot in am Still havent given himself insulin and needs teaching   Objective: Vitals:   11/01/20 1028 11/01/20 1052 11/02/20 0000 11/02/20 0739  BP: 115/71 106/69 109/60 110/72  Pulse: 72 69 72 73  Resp: 18  18 20   Temp: (!) 97.5 F (36.4 C) (!) 97.4 F (36.3 C) 97.8 F (36.6 C) 98.1 F (36.7 C)  TempSrc:  Oral Oral Oral  SpO2: 98% 99% 98% 99%  Weight:      Height:        Intake/Output Summary (Last 24 hours) at 11/02/2020 1404 Last data filed at 11/02/2020 0900 Gross per 24 hour  Intake --  Output 1100 ml  Net -1100 ml    Filed Weights   10/30/20 1641  Weight: 72.6 kg    Examination:  no focal deficit no rales rhonci or wheeze Abd soft nt nd no rebound Wounds  in re-inforced dressings bilaterally Neuro intact no focal deficit S1 s2 no m/r/g  Data Reviewed: personally reviewed   CBC    Component Value Date/Time   WBC 9.8 11/01/2020 0224   RBC 3.44 (L) 11/01/2020 0224   HGB 10.7 (L) 11/01/2020 0224   HCT 31.5 (L) 11/01/2020 0224   PLT 707 (H) 11/01/2020 0224   MCV 91.6 11/01/2020 0224   MCH 31.1 11/01/2020 0224   MCHC 34.0 11/01/2020 0224   RDW 12.2 11/01/2020 0224   LYMPHSABS 1.6 11/01/2020 0224   MONOABS 0.6 11/01/2020 0224   EOSABS 0.1 11/01/2020 0224   BASOSABS 0.0 11/01/2020 0224   CMP Latest Ref Rng & Units 11/01/2020 10/31/2020 10/30/2020  Glucose 70 - 99 mg/dL 11/01/2020) 818(E) 993(Z)  BUN 8 - 23 mg/dL 8 11 15   Creatinine 0.61 - 1.24 mg/dL 169(C) 7.89(F  Sodium 135 - 145 mmol/L 134(L) 131(L) 129(L)  Potassium 3.5 - 5.1 mmol/L 4.1 3.9 4.4  Chloride 98 - 111 mmol/L 104 101 95(L)  CO2 22 - 32 mmol/L 22 22 21(L)  Calcium 8.9 - 10.3 mg/dL 8.1(L) 8.2(L) 9.0  Total Protein 6.5 - 8.1 g/dL - 6.6 8.10)  Total Bilirubin 0.3 - 1.2 mg/dL - 0.5 0.5  Alkaline Phos 38 - 126 U/L - 88 109  AST 15 - 41 U/L - 22 51(H)  ALT 0 - 44 U/L - 26 34  Radiology Studies: DG MINI C-ARM IMAGE ONLY  Result Date: 11/01/2020 There is no interpretation for this exam.  This order is for images obtained during a surgical procedure.  Please See "Surgeries" Tab for more information regarding the procedure.     Scheduled Meds:  (feeding supplement) PROSource Plus  30 mL Oral BID BM   COVID-19 mRNA bivalent vaccine (Pfizer)  0.3 mL Intramuscular ONCE-1600   insulin aspart  0-15 Units Subcutaneous TID WC   insulin aspart protamine- aspart  12 Units Subcutaneous BID WC   [START ON 11/03/2020] metFORMIN  500 mg Oral Q breakfast   multivitamin with minerals  1 tablet Oral Daily   Ensure Max Protein  11 oz Oral BID   Continuous Infusions:     LOS: 3 days   Time spent: 15  Rhetta Mura, MD [facility] To contact the attending  provider between 7A-7P or the covering provider during after hours 7P-7A, please log into the web site [email or web address] and access using universal [place] password for that web site. If you do not have the password, please call the hospital operator.  11/02/2020, 2:04 PM

## 2020-11-02 NOTE — Evaluation (Signed)
Physical Therapy Evaluation Patient Details Name: Kenneth Richards MRN: [id number] DOB: 03/18/1960 Today's Date: 11/02/2020  History of Present Illness  60 year old male diet-controlled diabetes mellitus not on any meds--2-3 week h/o leg wound which worsened  Found septic on admit with foul smelling toes, gangrene; S/p L 3rd toe amputation and R great toe amputation on 10/16; strict elevation and weight bearing through heels only  Clinical Impression   Patient is s/p above surgery resulting in functional limitations due to the deficits listed below (see PT Problem List). 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          Recommendations for follow up therapy are one component of a multi-disciplinary discharge planning process, led by the attending physician.  Recommendations may be updated based on patient status, additional functional criteria and insurance authorization.  Follow Up Recommendations No PT follow up;Other (comment) (Consider HHRN for chronic disease management)    Equipment Recommendations  None recommended by PT    Recommendations for Other Services       Precautions / Restrictions Precautions Precautions: Fall Precaution Comments: fall risk is present, but monimal, especially with use of RW Restrictions Other Position/Activity Restrictions: Bear weight through heels only bil feet      Mobility  Bed Mobility Overal bed mobility: Modified Independent                  Transfers Overall transfer level: Needs assistance Equipment used: Rolling walker (2 wheeled) Transfers: Sit to/from Stand Sit to Stand: Min guard         General transfer comment: able to keep weight on  heels  Ambulation/Gait Ambulation/Gait assistance: Min guard Gait Distance (Feet): 6 Feet Assistive device: Rolling walker (2 wheeled)       General Gait Details: Able to keep weight on his heels; opted to limit amb distance today as he did not have postop shoes yet  Stairs            Wheelchair Mobility    Modified Rankin (Stroke Patients Only)       Balance Overall balance assessment: Mild deficits observed, not formally tested                                           Pertinent Vitals/Pain Pain Assessment: No/denies pain    Home Living Family/patient expects to be discharged to:: Private residence Living Arrangements: Alone Available Help at Discharge: Friend(s);Available PRN/intermittently Type of Home: Apartment Home Access: Level entry     Home Layout: One level Home Equipment: Walker - 4 wheels      Prior Function Level of Independence: Independent         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        Extremity/Trunk Assessment   Upper Extremity Assessment Upper Extremity Assessment: Overall WFL for tasks assessed    Lower Extremity Assessment Lower Extremity Assessment: Overall WFL for tasks assessed (feet with bulky dressings bilaterally)       Communication   Communication: No difficulties  Cognition Arousal/Alertness: Awake/alert Behavior During Therapy: WFL for tasks assessed/performed Overall Cognitive Status: Within Functional Limits  for tasks assessed                                        General Comments General comments (skin integrity, edema, etc.): we discussed inspecting feet every night    Exercises     Assessment/Plan    PT Assessment Patient needs continued PT services  PT Problem List Decreased activity tolerance;Decreased mobility;Decreased coordination;Decreased knowledge of precautions       PT Treatment Interventions DME instruction;Gait training;Stair  training;Functional mobility training;Therapeutic activities;Therapeutic exercise;Balance training;Neuromuscular re-education;Cognitive remediation    PT Goals (Current goals can be found in the Care Plan section)  Acute Rehab PT Goals Patient Stated Goal: to heal well PT Goal Formulation: With patient Time For Goal Achievement: 11/16/20 Potential to Achieve Goals: Good    Frequency Min 3X/week   Barriers to discharge        Co-evaluation               AM-PAC PT "6 Clicks" Mobility  Outcome Measure Help needed turning from your back to your side while in a flat bed without using bedrails?: None Help needed moving from lying on your back to sitting on the side of a flat bed without using bedrails?: None Help needed moving to and from a bed to a chair (including a wheelchair)?: None Help needed standing up from a chair using your arms (e.g., wheelchair or bedside chair)?: A Little Help needed to walk in hospital room?: A Little Help needed climbing 3-5 steps with a railing? : A Little 6 Click Score: 21    End of Session Equipment Utilized During Treatment: Gait belt Activity Tolerance: Patient tolerated treatment well Patient left: with call bell/phone within reach;in chair Nurse Communication: Mobility status      Time: 1610-9604 PT Time Calculation (min) (ACUTE ONLY): 34 min   Charges:   PT Evaluation $PT Eval Low Complexity: 1 Low PT Treatments $Gait Training: 8-22 mins        Van Clines, PT  Acute Rehabilitation Services Pager [phone] Office [phone]   Levi Aland 11/02/2020, 5:28 PM

## 2020-11-02 NOTE — Progress Notes (Signed)
Physical Therapy Note  PT eval complete with full note to follow;  Recommend DC home for transition out of hospital;  Likely will not need PT follow up -- can consider [facility] for chronic disease management;  Recommend bilateral Darco shoes to help with off-loading forefeet, and keeping weight on heels when ambulating;   Will follow,   Van Clines, PT  Acute Rehabilitation Services Pager [phone] Office [phone]

## 2020-11-03 ENCOUNTER — Other Ambulatory Visit (HOSPITAL_COMMUNITY): Payer: Self-pay

## 2020-11-03 LAB — SURGICAL PATHOLOGY

## 2020-11-03 LAB — GLUCOSE, CAPILLARY
Glucose-Capillary: 113 mg/dL — ABNORMAL HIGH (ref 70–99)
Glucose-Capillary: 232 mg/dL — ABNORMAL HIGH (ref 70–99)

## 2020-11-03 LAB — URINE CULTURE: Culture: NO GROWTH

## 2020-11-03 MED ORDER — OXYCODONE-ACETAMINOPHEN 5-325 MG PO TABS
1.0000 | ORAL_TABLET | ORAL | 0 refills | Status: AC | PRN
  Filled 2020-11-03: qty 30, 3d supply, fill #0

## 2020-11-03 MED ORDER — "AQUACEL AG ADVANTAGE 4""X5"" EX PADS"
1.0000 | MEDICATED_PAD | Freq: Every day | CUTANEOUS | 1 refills | Status: DC
  Filled 2020-11-03: qty 30, fill #0

## 2020-11-03 MED ORDER — AMOXICILLIN-POT CLAVULANATE 875-125 MG PO TABS
1.0000 | ORAL_TABLET | Freq: Two times a day (BID) | ORAL | Status: DC
  Administered 2020-11-03: 1 via ORAL
  Filled 2020-11-03: qty 1

## 2020-11-03 MED ORDER — METFORMIN HCL 500 MG PO TABS
ORAL_TABLET | ORAL | 11 refills | Status: DC
  Filled 2020-11-03: qty 60, 3d supply, fill #0

## 2020-11-03 MED ORDER — GLUCOSE BLOOD VI STRP
ORAL_STRIP | 0 refills | Status: AC
  Filled 2020-11-03: qty 100, 25d supply, fill #0

## 2020-11-03 MED ORDER — BLOOD GLUCOSE MONITOR SYSTEM W/DEVICE KIT
PACK | 0 refills | Status: AC
  Filled 2020-11-03: qty 1, 1d supply, fill #0

## 2020-11-03 MED ORDER — INSULIN ASPART PROT & ASPART (70-30 MIX) 100 UNIT/ML ~~LOC~~ SUSP
12.0000 [IU] | Freq: Two times a day (BID) | SUBCUTANEOUS | 11 refills | Status: DC
  Filled 2020-11-03: qty 10, 42d supply, fill #0

## 2020-11-03 MED ORDER — AMOXICILLIN-POT CLAVULANATE 875-125 MG PO TABS
1.0000 | ORAL_TABLET | Freq: Two times a day (BID) | ORAL | 0 refills | Status: AC
  Filled 2020-11-03: qty 18, 9d supply, fill #0

## 2020-11-03 MED ORDER — INSULIN ASPART PROT & ASPART (70-30 MIX) 100 UNIT/ML PEN
12.0000 [IU] | PEN_INJECTOR | Freq: Two times a day (BID) | SUBCUTANEOUS | 11 refills | Status: AC
  Filled 2020-11-03: qty 6, 25d supply, fill #0

## 2020-11-03 MED ORDER — METFORMIN HCL 500 MG PO TABS
ORAL_TABLET | ORAL | 11 refills | Status: AC
  Filled 2020-11-03: qty 60, 30d supply, fill #0

## 2020-11-03 MED ORDER — INSULIN PEN NEEDLE 32G X 4 MM MISC
0 refills | Status: AC
  Filled 2020-11-03: qty 100, 30d supply, fill #0

## 2020-11-03 MED ORDER — ACETAMINOPHEN 325 MG PO TABS: 650.0000 mg | ORAL_TABLET | Freq: Four times a day (QID) | ORAL | Status: DC | PRN

## 2020-11-03 MED ORDER — ACCU-CHEK SOFTCLIX LANCETS MISC
0 refills | Status: AC
  Filled 2020-11-03: qty 100, 30d supply, fill #0

## 2020-11-03 NOTE — Discharge Summary (Addendum)
Physician Discharge Summary  Kenneth Richards NWG:[id number] DOB: [birth year] DOA: 10/30/2020  PCP: Pcp, No  Admit date: 10/30/2020 Discharge date: 11/03/2020  Time spent: 40 minutes  Recommendations for Outpatient Follow-up:  PCP follow up to be set up Needs Wound review Dr. Kathaleen Bury ~ 1-2 weeks--complete antibiotics as per below Get CBC and Chem-12 in about 1 week Will need close control of glycemic control and follow-up  Discharge Diagnoses:  MAIN problem for hospitalization   Gangrene of toes of both feet  Please see below for itemized issues addressed in HOpsital- refer to other progress notes for clarity if needed  Discharge Condition: Improved  Diet recommendation: Diabetic heart healthy  Filed Weights   10/30/20 1641  Weight: 72.6 kg    History of present illness:  60 year old male diet-controlled diabetes mellitus not on any meds,devleoped cellulitis and swlling of toes Has not seen PCP in ~5-7 y Septic on admit T-max 100.1 white count 15  Hospital Course:  Sepsis secondary to possible gangrene underlying osteo of toes bilateralls S/p amputation of digits Dr. Kathaleen Bury 10.16  Left ankle decubitus Initially placed on ceftriaxone Flagyl vancomycin--Source control obtained but recommended by orthopedics to discharge on 2 weeks of antibiotics as below Discharged on oxycodone/Oxy IR for post op pain control Stabilized does understand the need for ankle care and wound coverage and we have prescribed Aquacel Uncontrolled DM TY 2 with hyperglycemia  previously diet-controlled A1c this admit 10.3 Will need insulin dosing --also no insurance Will change to flexpen 70/30 bid 12 u and adjust based on SSI--start metformin 500 qd for 2 days then increase to BID Continue mod SSI--CBGs much better patient has demonstrated ability to give himself insulin Diabetic coordinator input noted Going home with close follow-up  Discharge Exam: Vitals:   11/02/20 0739 11/02/20 1435   BP: 110/72 102/66  Pulse: 73 74  Resp: 20 18  Temp: 98.1 F (36.7 C) 98.2 F (36.8 C)  SpO2: 99% 100%    Subj on day of d/c   Awake coherent no distress no pain no fever no chills  General Exam on discharge  Coherent pleasant no distress EOMI NCAT no focal deficit CTA B no added sound rales rhonchi ROM intact Examined Power 5/5 Smile symmetric  Discharge Instructions   Discharge Instructions     Diet - low sodium heart healthy   Complete by: As directed    Discharge instructions   Complete by: As directed    Take medications as directed We will call in Rx for diabetes and testing strips and glucometer  You will be set-up with a new MD Go back to ortho pedics in 1 weeks to have your LE wounds reviewed  Make sure u keep a log of your sugars   Discharge wound care:   Complete by: As directed    11/01/20 1826    Wound care  Daily      Comments: Clean the wound with NS then apply a cut to fit piece of Aquacel Advantage Kellie Simmering # [id number]) over the wound and secure with a foam dressing. Carefully peel back the foam dressing and change the Aquacel daily. May use the foam dressing up to 3 days unless soiled.   Increase activity slowly   Complete by: As directed       Allergies as of 11/03/2020   No Known Allergies      Medication List     STOP taking these medications    sulfamethoxazole-trimethoprim 800-160 MG tablet Commonly known as:  BACTRIM DS       TAKE these medications    Accu-Chek Softclix Lancets lancets Use as directed   acetaminophen 325 MG tablet Commonly known as: TYLENOL Take 2 tablets (650 mg total) by mouth every 6 (six) hours as needed for mild pain (or Fever >/= 101).   amoxicillin-clavulanate 875-125 MG tablet Commonly known as: AUGMENTIN Take 1 tablet by mouth every 12 (twelve) hours for 9 days.   Aquacel Ag Advantage 4"X5" Pads Apply 1 packet topically daily.   Blood Glucose Monitor System w/Device Kit Use as directed    glucose blood test strip Use as directed up to 4 times daily.   insulin aspart protamine - aspart (70-30) 100 UNIT/ML FlexPen Commonly known as: NOVOLOG 70/30 MIX Inject 12 Units into the skin 2 (two) times daily with a meal.   Insulin Pen Needle 32G X 4 MM Misc Use with insulin pen   metFORMIN 500 MG tablet Commonly known as: GLUCOPHAGE Take 1 tablet (500 mg total) by mouth daily with breakfast for 3 days AND then take1 tablet (500 mg total) two times daily with a meal.   oxyCODONE-acetaminophen 5-325 MG tablet Commonly known as: PERCOCET/ROXICET Take 1-2 tablets by mouth every 4 (four) hours as needed for moderate pain.               Discharge Care Instructions  (From admission, onward)           Start     Ordered   11/03/20 0000  Discharge wound care:       Comments: 11/01/20 1826    Wound care  Daily      Comments: Clean the wound with NS then apply a cut to fit piece of Aquacel Advantage Kellie Simmering # [phone]) over the wound and secure with a foam dressing. Carefully peel back the foam dressing and change the Aquacel daily. May use the foam dressing up to 3 days unless soiled.   11/03/20 0930           No Known Allergies  Follow-up Information     [facility] Follow up on 11/11/2020.   Why: 3:10 pm with Freeman Caldron, post hospital follow up and to establish primary care Contact information: [address] [name] [address] [phone]                 The results of significant diagnostics from this hospitalization (including imaging, microbiology, ancillary and laboratory) are listed below for reference.    Significant Diagnostic Studies: DG Chest 1 View  Result Date: 10/30/2020 CLINICAL DATA:  Questionable sepsis EXAM: CHEST  1 VIEW COMPARISON:  Stent FINDINGS: The heart and mediastinal contours are within normal limits. No focal consolidation. No pulmonary edema. No pleural effusion. No  pneumothorax. No acute osseous abnormality. IMPRESSION: No active disease. Electronically Signed   By: Iven Finn M.D.   On: 10/30/2020 18:14   MR FOOT RIGHT W WO CONTRAST  Result Date: 10/31/2020 CLINICAL DATA:  Diabetic foot wound at the right great toe EXAM: MRI OF THE RIGHT FOREFOOT WITHOUT AND WITH CONTRAST TECHNIQUE: Multiplanar, multisequence MR imaging of the right forefoot was performed before and after the administration of intravenous contrast. CONTRAST:  7.14m GADAVIST GADOBUTROL 1 MMOL/ML IV SOLN COMPARISON:  X-ray 10/30/2020 FINDINGS: Bones/Joint/Cartilage Cortical erosion involving the distal phalanx of the right great toe with marked bone marrow edema, enhancement, and confluent low T1 signal changes compatible with acute osteomyelitis. There is a ulceration along  the plantar surface of the great toe distally with peripherally enhancing ulcer base surrounding the distal phalanx of the great toe. Collection, contiguous with the ulcer base, measures approximately 2.3 x 1.7 x 2.2 cm (series 5, image 19). Preserved bone marrow signal within the proximal phalanx of the great toe. No additional sites of bony destruction or marrow replacement. No acute fracture. 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 Ligaments Intact Lisfranc ligament.  No evidence of acute ligamentous injury. Muscles and Tendons Denervation changes of the foot musculature. No significant tenosynovial fluid collection. Soft tissues Great toe ulcer with associated collection and surrounding soft tissue edema/cellulitis. No additional fluid collections or ulcerations. IMPRESSION: 1. Acute osteomyelitis of the distal phalanx of the right great toe. 2. Ulceration underlying the plantar aspect of the right great toe with contiguous 2.2 cm peripherally-enhancing abscess. 3. 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 Electronically Signed   By: Davina Poke D.O.   On: 10/31/2020 11:38   MR  FOOT LEFT W WO CONTRAST  Result Date: 10/31/2020 CLINICAL DATA:  Diabetic foot wound involving the left third toe EXAM: MRI OF THE LEFT FOREFOOT WITHOUT AND WITH CONTRAST TECHNIQUE: Multiplanar, multisequence MR imaging of the left foot was performed both before and after administration of intravenous contrast. CONTRAST:  7.81m GADAVIST GADOBUTROL 1 MMOL/ML IV SOLN COMPARISON:  X-ray 10/30/2020 FINDINGS: Bones/Joint/Cartilage Bony destruction involving the distal phalanx of the left third toe with associated confluent low T1 marrow signal compatible with acute osteomyelitis. Mild bone marrow edema within the middle phalanx of the third toe without definite bony destruction or marrow replacement. Remaining osseous structures of the left foot are intact. No fracture. No dislocation. No additional sites of bone destruction or marrow replacement. 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 Ligaments Intact Lisfranc ligament. No evidence of acute ligamentous injury of the foot. Muscles and Tendons Denervation changes of the intrinsic foot musculature. No significant tenosynovial fluid collection. Soft tissues Ulceration involving the distal aspect of the third toe with soft tissue swelling and edema. No organized or rim enhancing fluid collection. Small volume third intermetatarsal space bursitis. IMPRESSION: 1. 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 2. Ulceration involving the distal aspect of the left third toe with associated cellulitis. No abscess. 3. Mild bone marrow edema within the middle phalanx of the third toe without definite bony destruction or marrow replacement, suggesting reactive osteitis. 4. Small volume third intermetatarsal space bursitis. 5. 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 Electronically Signed   By: NDavina PokeD.O.   On: 10/31/2020 11:43   DG Foot Complete Left  Result Date: 10/30/2020 CLINICAL DATA:   Bilateral toe infections.  Diabetic. EXAM: LEFT FOOT - COMPLETE 3+ VIEW COMPARISON:  None. FINDINGS: Soft tissue infection and/or bandage material about the distal third digit. No soft tissue gas. Hallux valgus deformity with degenerative changes of the first metatarsophalangeal joint. Diffuse mild forefoot and midfoot soft tissue swelling. Questionable cortical irregularity involving the distal third digit. No other osseous destruction identified. IMPRESSION: Soft tissue infection and/or bandage material about the distal third digit. Presuming there is bandage material, makes evaluation of osseous structures challenging. Cannot exclude cortical irregularity involving the tuft of the third digit. Consider MRI to exclude osteomyelitis. Electronically Signed   By: KAbigail MiyamotoM.D.   On: 10/30/2020 18:19   DG Foot Complete Right  Result Date: 10/30/2020 CLINICAL DATA:  Question sepsis. non compliant diabetic d/t being unaware  he is diabetic. Open sore on right foot. Toes significant decay EXAM: RIGHT FOOT COMPLETE - 3+ VIEW COMPARISON:  None. FINDINGS: Markedly limited evaluation due to overlapping gauze overlying the first digit distal phalanx. Cortical erosion and destruction of the lateral first digit distal phalanx. There is no evidence of fracture or dislocation. There is no evidence of severe arthropathy. Subcutaneus soft tissue edema of the first digit. IMPRESSION: Cortical erosion and destruction of the lateral first digit distal phalanx consistent with osteomyelitis. Electronically Signed   By: Iven Finn M.D.   On: 10/30/2020 18:26   DG MINI C-ARM IMAGE ONLY  Result Date: 11/01/2020 There is no interpretation for this exam.  This order is for images obtained during a surgical procedure.  Please See "Surgeries" Tab for more information regarding the procedure.    Microbiology: Recent Results (from the past 240 hour(s))  Resp Panel by RT-PCR (Flu A&B, Covid) Nasopharyngeal Swab     Status:  None   Collection Time: 10/30/20  4:54 PM   Specimen: Nasopharyngeal Swab; Nasopharyngeal(NP) swabs in vial transport medium  Result Value Ref Range Status   SARS Coronavirus 2 by RT PCR NEGATIVE NEGATIVE Final    Comment: (NOTE) SARS-CoV-2 target nucleic acids are NOT DETECTED.  The SARS-CoV-2 RNA is generally detectable in upper respiratory specimens during the acute phase of infection. The lowest concentration of SARS-CoV-2 viral copies this assay can detect is 138 copies/mL. A negative result does not preclude SARS-Cov-2 infection and should not be used as the sole basis for treatment or other patient management decisions. A negative result may occur with  improper specimen collection/handling, submission of specimen other than nasopharyngeal swab, presence of viral mutation(s) within the areas targeted by this assay, and inadequate number of viral copies(<138 copies/mL). A negative result must be combined with clinical observations, patient history, and epidemiological information. The expected result is Negative.  Fact Sheet for Patients:  EntrepreneurPulse.com.au  Fact Sheet for Healthcare Providers:  IncredibleEmployment.be  This test is no t yet approved or cleared by the [place] FDA and  has been authorized for detection and/or diagnosis of SARS-CoV-2 by FDA under an Emergency Use Authorization (EUA). This EUA will remain  in effect (meaning this test can be used) for the duration of the COVID-19 declaration under Section 564(b)(1) of the Act, 21 U.S.C.section 360bbb-3(b)(1), unless the authorization is terminated  or revoked sooner.       Influenza A by PCR NEGATIVE NEGATIVE Final   Influenza B by PCR NEGATIVE NEGATIVE Final    Comment: (NOTE) The Xpert Xpress SARS-CoV-2/FLU/RSV plus assay is intended as an aid in the diagnosis of influenza from Nasopharyngeal swab specimens and should not be used as a sole basis for  treatment. Nasal washings and aspirates are unacceptable for Xpert Xpress SARS-CoV-2/FLU/RSV testing.  Fact Sheet for Patients: EntrepreneurPulse.com.au  Fact Sheet for Healthcare Providers: IncredibleEmployment.be  This test is not yet approved or cleared by the [place] FDA and has been authorized for detection and/or diagnosis of SARS-CoV-2 by FDA under an Emergency Use Authorization (EUA). This EUA will remain in effect (meaning this test can be used) for the duration of the COVID-19 declaration under Section 564(b)(1) of the Act, 21 U.S.C. section 360bbb-3(b)(1), unless the authorization is terminated or revoked.  Performed at [facility], [place] [address] [place], [place] [address]   Blood Culture (routine x 2)     Status: None (Preliminary result)   Collection Time: 10/30/20  5:10 PM  Specimen: BLOOD RIGHT ARM  Result Value Ref Range Status   Specimen Description BLOOD RIGHT ARM  Final   Special Requests   Final    BOTTLES DRAWN AEROBIC AND ANAEROBIC Blood Culture adequate volume   Culture   Final    NO GROWTH 4 DAYS Performed at [facility], [address] [place], [place] [address]    Report Status PENDING  Incomplete  Blood Culture (routine x 2)     Status: None (Preliminary result)   Collection Time: 10/30/20  8:35 PM   Specimen: BLOOD  Result Value Ref Range Status   Specimen Description BLOOD RIGHT ANTECUBITAL  Final   Special Requests   Final    BOTTLES DRAWN AEROBIC AND ANAEROBIC Blood Culture adequate volume   Culture   Final    NO GROWTH 4 DAYS Performed at [facility], [place] [address], [place], [place] [address]    Report Status PENDING  Incomplete  Surgical pcr screen     Status: None   Collection Time: 10/31/20  6:40 PM   Specimen: Nasal Mucosa; Nasal Swab  Result Value Ref Range Status   MRSA, PCR NEGATIVE NEGATIVE Final   Staphylococcus aureus NEGATIVE NEGATIVE Final    Comment:  (NOTE) The Xpert SA Assay (FDA approved for NASAL specimens in patients 30 years of age and older), is one component of a comprehensive surveillance program. It is not intended to diagnose infection nor to guide or monitor treatment. Performed at [facility], [place] [address], [place], [place] [address]      Labs: Basic Metabolic Panel: Recent Labs  Lab 10/30/20 1730 10/31/20 0315 11/01/20 0224  NA 129* 131* 134*  K 4.4 3.9 4.1  CL 95* 101 104  CO2 21* 22 22  GLUCOSE 261* 238* 235*  BUN 15 11 8   CREATININE 1.05 0.77 0.60*  CALCIUM 9.0 8.2* 8.1*   Liver Function Tests: Recent Labs  Lab 10/30/20 1730 10/31/20 0315  AST 51* 22  ALT 34 26  ALKPHOS 109 88  BILITOT 0.5 0.5  PROT 8.3* 6.6  ALBUMIN 2.9* 2.2*   No results for input(s): LIPASE, AMYLASE in the last 168 hours. No results for input(s): AMMONIA in the last 168 hours. CBC: Recent Labs  Lab 10/30/20 1730 10/31/20 0315 11/01/20 0224  WBC 15.1* 13.3* 9.8  NEUTROABS 12.8* 10.4* 7.3  HGB 12.2* 11.1* 10.7*  HCT 36.3* 32.3* 31.5*  MCV 92.8 91.5 91.6  PLT 900* 765* 707*   Cardiac Enzymes: No results for input(s): CKTOTAL, CKMB, CKMBINDEX, TROPONINI in the last 168 hours. BNP: BNP (last 3 results) No results for input(s): BNP in the last 8760 hours.  ProBNP (last 3 results) No results for input(s): PROBNP in the last 8760 hours.  CBG: Recent Labs  Lab 11/02/20 0742 11/02/20 1135 11/02/20 1601 11/02/20 2159 11/03/20 0642  GLUCAP 172* 223* 140* 156* 113*       Signed:  Nita Sells MD   [facility] 11/03/2020, 9:39 AM

## 2020-11-03 NOTE — Discharge Instructions (Signed)
Nutrition Post Hospital Stay Proper nutrition can help your body recover from illness and injury.   Foods and beverages high in protein, vitamins, and minerals help rebuild muscle loss, promote healing, & reduce fall risk.   In addition to eating healthy foods, a nutrition shake is an easy, delicious way to get the nutrition you need during and after your hospital stay  It is recommended that you continue to drink 2 bottles per day of:  Glucerna for at least 1 month (30 days) after your hospital stay   Tips for adding a nutrition shake into your routine: As allowed, drink one with vitamins or medications instead of water or juice Enjoy one as a tasty mid-morning or afternoon snack Drink cold or make a milkshake out of it Drink one instead of milk with cereal or snacks Use as a coffee creamer   Available at the following grocery stores and pharmacies:           * Karin Golden * [employer] * [employer]co  * Rite Aid          * Walmart * [employer] Club  * Walgreens      * Target  * BJ's   * CVS  * [employer]   * [facility] [phone]            For COUPONS visit: [email or web address]/join or [email or web address].br   Suggested Substitutions Glucerna Shake = Boost Glucose Control = Carnation Breakfast Essentials SUGAR FREE

## 2020-11-03 NOTE — Progress Notes (Signed)
Discharge summary packet provided to pt with instructions. Pt verbalized understanding of instrucitions D/C to home as ordered, Meds delivered from [facility]. Pt remains alert.oriented in no apparent distress. Family member is responsible for pt's ride.

## 2020-11-03 NOTE — Progress Notes (Addendum)
Nutrition Follow-up  DOCUMENTATION CODES:   Severe malnutrition in context of chronic illness  INTERVENTION:   Continue multivitamin w/ minerals daily  Continue 30 ml ProSource Plus BID, each supplement provides 100 kcals and 15 grams protein.   Continue Ensure Max po BID, each supplement provides 150 kcal and 30 grams of protein.   Encourage good PO intake  Diet education provided; Carbohydrate Counting and Plate Method for Diabetes.  Referral for outpatient diabetes management provided.  NUTRITION DIAGNOSIS:   Severe Malnutrition related to chronic illness as evidenced by severe fat depletion, severe muscle depletion.  GOAL:   Patient will meet greater than or equal to 90% of their needs - Progressing   MONITOR:   PO intake, Supplement acceptance, Labs, Skin  REASON FOR ASSESSMENT:   Consult Wound healing  ASSESSMENT:   Pt with PMH significant for type 2 DM admitted with sepsis 2/2 possible gangrene and underlying osteomyelitis of toe L foot  10/16: L toe ans R great toe amputation  Pt is originally from [place], moved here 7 years. Pt stated that he was told he was pre-diabetic when still living there but when her moved here they said that he was no longer at risk and was taken off all medications for diabetes.  Pt reports that his appetite has been great PTA and while here. Reports that he is a big meat and potatoes guy. States that he typically eats things like corn beef, eggs, canned vegetables, rice, and soda. Pt states that he recently stopped drinking soda and switched to water when he was told his diabetes was back at his recent doctor visit.  Pt reports that he was 165# last week when he went to the dr, by time he had got here he was weighing in the 150's. Pt reports that he does not keep up on his weight and was unable to provide his UBW, but has noticed weight loss due to clothes fitting looser. Pt states that he use to weight 270# back in the early 2000's  when he was first told that he was pre-diabetic, he begin loosing weight due to knowing that it would help with his diabetes.    PLDN consulted for nutrition education regarding diabetes.   PLDN provided "Carbohydrate Counting for People with Diabetes" handout from the Academy of Nutrition and Dietetics. Discussed different food groups and their effects on blood sugar, emphasizing carbohydrate-containing foods. Provided list of carbohydrates. Discussed importance of controlled and consistent carbohydrate intake throughout the day. Provided examples of ways to balance meals/snacks. Teach back method used.  PLDN also provided "Plate Method for Diabetes" Foods with carbohydrates make your blood glucose level go up. The plate method is a simple way to meal plan and control the amount of carbohydrate you eat.  Expect good compliance.  Discussed the continue use of ONS and a high protein diet at home due to wound healing.  Medications reviewed and include: Augmentin, SSI 0-15 units TID, 12 units BID, Metformin, MVI Labs reviewed: 24 hr BG trends 113-223, Hgb A1c 10.3%  NUTRITION - FOCUSED PHYSICAL EXAM:  Flowsheet Row Most Recent Value  Orbital Region Severe depletion  Upper Arm Region Moderate depletion  Thoracic and Lumbar Region Moderate depletion  Buccal Region Severe depletion  Temple Region Severe depletion  Clavicle Bone Region Severe depletion  Clavicle and Acromion Bone Region Severe depletion  Scapular Bone Region Severe depletion  Dorsal Hand Severe depletion  Patellar Region Mild depletion  Anterior Thigh Region Mild depletion  Posterior  Calf Region Mild depletion  Edema (RD Assessment) None  Hair Reviewed  Eyes Reviewed  Mouth Reviewed  [Missing teeth]  Skin Reviewed  Nails Reviewed       Diet Order:   Diet Order             Diet - low sodium heart healthy           Diet Carb Modified Fluid consistency: Thin; Room service appropriate? Yes  Diet effective now                    EDUCATION NEEDS:   Education needs have been addressed  Skin:  Skin Assessment: Skin Integrity Issues: Skin Integrity Issues:: Diabetic Ulcer, Other (Comment), Incisions Diabetic Ulcer: L foot, L foot toes, R foot toes Incisions: foot Other: laceration R foot  Last BM:  11/01/2020  Height:   Ht Readings from Last 1 Encounters:  10/30/20 6' (1.829 m)    Weight:   Wt Readings from Last 1 Encounters:  10/30/20 72.6 kg    Ideal Body Weight:  80.9 kg  BMI:  Body mass index is 21.7 kg/m.  Estimated Nutritional Needs:   Kcal:  2200-2400  Protein:  110-130 grams  Fluid:  >/= 2 L    Diamonte Stavely BS, PLDN Clinical Dietitian See AMiON for contact information.

## 2020-11-03 NOTE — Progress Notes (Signed)
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 on 10/16; strict elevation and weight bearing through heels only    PT Comments    Continuing work on functional mobility and activity tolerance;  Session focused on managing gait, mobility, and home management with Darco shoes for protection and to help offload forefeet; initially unsteady on Darcos, which is not unusual; Better with practice; Able to don Darcos, but with some difficulty due to decr knee ROM into flexion (more difficulty with L knee than R) -- incr time, but able to do it satisfactorily; he tellsm e he can aslo call in his neighbors to make sure his Darcos are on securely   Recommendations for follow up therapy are one component of a multi-disciplinary discharge planning process, led by the attending physician.  Recommendations may be updated based on patient status, additional functional criteria and insurance authorization.  Follow Up Recommendations  No PT follow up;Other (comment) (Consider HHRN for chronic disease management)     Equipment Recommendations  None recommended by PT    Recommendations for Other Services       Precautions / Restrictions Precautions Precautions: Fall Precaution Comments: fall risk is present, but monimal, especially with use of RW Restrictions Other Position/Activity Restrictions: Bear weight through heels only bil feet     Mobility  Bed Mobility Overal bed mobility: Modified Independent                  Transfers Overall transfer level: Needs assistance Equipment used: Rolling walker (2 wheeled) Transfers: Sit to/from Stand Sit to Stand: Min guard (without  physical contact)         General transfer comment: able to keep weight on heels; slow rise and careful transition of hands from armrests to RW  Ambulation/Gait Ambulation/Gait assistance: Min guard Gait Distance (Feet): 120 Feet Assistive device: Rolling walker (2 wheeled) Gait Pattern/deviations: Step-through pattern;Decreased step length - right;Decreased step length - left     General Gait Details: Initially unsteady with Darco shoes; occasionally mildly losing balance posteriorly, but much improved with cues to keep RW slightly more forward in front of him   Stairs             Wheelchair Mobility    Modified Rankin (Stroke Patients Only)       Balance Overall balance assessment: Mild deficits observed, not formally tested                                          Cognition Arousal/Alertness: Awake/alert Behavior During Therapy: WFL for tasks assessed/performed Overall Cognitive Status: Within Functional Limits for tasks assessed                                 General Comments: Engaged in problem-solving re: donning and doffing darco shoes independently with limited L knee flexion ROM      Exercises      General Comments General comments (skin integrity, edema, etc.): We discussed seeing if his neighbors could make a daily date or check-in to see if they  can help with makeing sure darco shoes are on securely      Pertinent Vitals/Pain Pain Assessment: No/denies pain    Home Living                      Prior Function            PT Goals (current goals can now be found in the care plan section) Acute Rehab PT Goals Patient Stated Goal: to heal well PT Goal Formulation: With patient Time For Goal Achievement: 11/16/20 Potential to Achieve Goals: Good Progress towards PT goals: Progressing toward goals    Frequency    Min 3X/week      PT Plan Current plan remains appropriate    Co-evaluation               AM-PAC PT "6 Clicks" Mobility   Outcome Measure  Help needed turning from your back to your side while in a flat bed without using bedrails?: None Help needed moving from lying on your back to sitting on the side of a flat bed without using bedrails?: None Help needed moving to and from a bed to a chair (including a wheelchair)?: None Help needed standing up from a chair using your arms (e.g., wheelchair or bedside chair)?: A Little Help needed to walk in hospital room?: A Little Help needed climbing 3-5 steps with a railing? : A Little 6 Click Score: 21    End of Session Equipment Utilized During Treatment: Gait belt Activity Tolerance: Patient tolerated treatment well Patient left: Other (comment) (heading back into the room with nursing student) Nurse Communication: Mobility status PT Visit Diagnosis: Unsteadiness on feet (R26.81)     Time: 3790-2409 PT Time Calculation (min) (ACUTE ONLY): 21 min  Charges:  $Gait Training: 8-22 mins                     Van Clines, PT  Acute Rehabilitation Services Pager [phone] Office [phone]    Kenneth Richards 11/03/2020, 1:44 PM

## 2020-11-04 LAB — CULTURE, BLOOD (ROUTINE X 2)
Culture: NO GROWTH
Culture: NO GROWTH
Special Requests: ADEQUATE
Special Requests: ADEQUATE

## 2020-11-05 ENCOUNTER — Other Ambulatory Visit (HOSPITAL_COMMUNITY): Payer: Self-pay

## 2020-11-11 ENCOUNTER — Other Ambulatory Visit: Payer: Self-pay

## 2020-11-11 ENCOUNTER — Ambulatory Visit: Payer: Self-pay | Admitting: Physician Assistant

## 2020-11-11 NOTE — Progress Notes (Signed)
Patient ID: Kenneth Richards, male   DOB: [birth year], 60 y.o.   MRN: [id number]   After hospitalization 10/14-10/18   Recommendations for Outpatient Follow-up:  PCP follow up to be set up Needs Wound review Dr. Odis Hollingshead ~ 1-2 weeks--complete antibiotics as per below Get CBC and Chem-12 in about 1 week Will need close control of glycemic control and follow-up   Discharge Diagnoses:  MAIN problem for hospitalization    Gangrene of toes of both feet   History of present illness:  60 year old male diet-controlled diabetes mellitus not on any meds,devleoped cellulitis and swlling of toes Has not seen PCP in ~5-7 y Septic on admit T-max 100.1 white count 15   Hospital Course:  Sepsis secondary to possible gangrene underlying osteo of toes bilateralls S/p amputation of digits Dr. Odis Hollingshead 10.16  Left ankle decubitus Initially placed on ceftriaxone Flagyl vancomycin--Source control obtained but recommended by orthopedics to discharge on 2 weeks of antibiotics as below Discharged on oxycodone/Oxy IR for post op pain control Stabilized does understand the need for ankle care and wound coverage and we have prescribed Aquacel Uncontrolled DM TY 2 with hyperglycemia  previously diet-controlled A1c this admit 10.3 Will need insulin dosing --also no insurance Will change to flexpen 70/30 bid 12 u and adjust based on SSI--start metformin 500 qd for 2 days then increase to BID Continue mod SSI--CBGs much better patient has demonstrated ability to give himself insulin Diabetic coordinator input noted Going home with close follow-up

## 2020-11-11 NOTE — Telephone Encounter (Signed)
Put him in wait list--will focus only on DM.  Or can be seen as illness if opening

## 2021-01-20 ENCOUNTER — Ambulatory Visit: Payer: Self-pay | Admitting: Internal Medicine

## 2021-02-10 ENCOUNTER — Other Ambulatory Visit: Payer: Self-pay | Admitting: Podiatry

## 2021-02-10 ENCOUNTER — Other Ambulatory Visit: Payer: Self-pay

## 2021-02-10 ENCOUNTER — Ambulatory Visit (INDEPENDENT_AMBULATORY_CARE_PROVIDER_SITE_OTHER): Payer: Self-pay

## 2021-02-10 ENCOUNTER — Ambulatory Visit (INDEPENDENT_AMBULATORY_CARE_PROVIDER_SITE_OTHER): Payer: Self-pay | Admitting: Podiatry

## 2021-02-10 DIAGNOSIS — M2012 Hallux valgus (acquired), left foot: Secondary | ICD-10-CM

## 2021-02-10 DIAGNOSIS — L97522 Non-pressure chronic ulcer of other part of left foot with fat layer exposed: Secondary | ICD-10-CM

## 2021-02-10 DIAGNOSIS — E0843 Diabetes mellitus due to underlying condition with diabetic autonomic (poly)neuropathy: Secondary | ICD-10-CM

## 2021-02-10 MED ORDER — GENTAMICIN SULFATE 0.1 % EX CREA: 1.0000 "application " | TOPICAL_CREAM | Freq: Two times a day (BID) | CUTANEOUS | 1 refills | Status: DC

## 2021-02-10 MED ORDER — DOXYCYCLINE HYCLATE 100 MG PO TABS: 100.0000 mg | ORAL_TABLET | Freq: Two times a day (BID) | ORAL | 0 refills | Status: DC

## 2021-02-13 LAB — WOUND CULTURE
MICRO NUMBER:: 12918125
SPECIMEN QUALITY:: ADEQUATE

## 2021-02-13 LAB — HOUSE ACCOUNT TRACKING

## 2021-02-16 NOTE — Progress Notes (Signed)
° °  Subjective:  61 y.o. male with PMHx of diabetes mellitus presenting today as a referral from his PCP for concern of a callus/ulcer to the plantar aspect of the great toe left foot this been present 3-4 years.  Patient states that he develops a callus and he trims it off himself.  Exacerbated by his bunion deformity.  Today there is some minor drainage to the area.  He presents for follow-up treatment and evaluation   Past Medical History:  Diagnosis Date   Type 2 diabetes mellitus (HCC)     Objective/Physical Exam General: The patient is alert and oriented x3 in no acute distress.  Dermatology:  Wound #1 noted to the plantar aspect of the left great toe measuring approximately 1.0 x 1.0 x 0.2 cm (LxWxD).   To the noted ulceration(s), there is no eschar. There is a moderate amount of slough, fibrin, and necrotic tissue noted. Granulation tissue and wound base is red. There is a minimal amount of serosanguineous drainage noted. There is no exposed bone muscle-tendon ligament or joint. There is no malodor. Periwound is callused.  Overall the wound appears very chronic and stable Skin is warm, dry and supple bilateral lower extremities.  Vascular: Palpable pedal pulses bilaterally. No edema or erythema noted.  Vascular status is intact and there is no concern for vascular compromise  Neurological: Epicritic and protective threshold diminished bilaterally.   Musculoskeletal Exam: Hallux valgus deformity noted left foot  Assessment: 1.  Ulcer plantar aspect of the first MTP joint left secondary to diabetes mellitus 2. diabetes mellitus w/ peripheral neuropathy 3.  Hallux valgus left   Plan of Care:  1. Patient was evaluated. 2. medically necessary excisional debridement including subcutaneous tissue was performed using a tissue nipper and a chisel blade. Excisional debridement of all the necrotic nonviable tissue down to healthy bleeding viable tissue was performed with post-debridement  measurements same as pre-. 3. the wound was cleansed and dry sterile dressing applied. 4.  Cultures taken and sent to pathology for culture and sensitivity  5.  Prescription for doxycycline 100 mg 2 times daily  6.  Prescription for gentamicin cream applied daily  7.  Advised against going barefoot.  Recommend good supportive shoes and arch supports.   8.  Patient is to return to clinic in 2 weeks.  If there is no significant improvement we need to discuss more aggressive offloading techniques including cam boot or offloading within the insoles of the shoe   Felecia Shelling, DPM [facility]  Dr. Felecia Shelling, DPM    [address]                                        [place], [place] [address]                Office [phone]  Fax [phone]

## 2021-03-03 ENCOUNTER — Other Ambulatory Visit: Payer: Self-pay

## 2021-03-03 ENCOUNTER — Ambulatory Visit (INDEPENDENT_AMBULATORY_CARE_PROVIDER_SITE_OTHER): Payer: Self-pay | Admitting: Podiatry

## 2021-03-03 DIAGNOSIS — E0843 Diabetes mellitus due to underlying condition with diabetic autonomic (poly)neuropathy: Secondary | ICD-10-CM

## 2021-03-03 DIAGNOSIS — L97522 Non-pressure chronic ulcer of other part of left foot with fat layer exposed: Secondary | ICD-10-CM

## 2021-03-03 NOTE — Progress Notes (Signed)
° °  Subjective:  61 y.o. male with PMHx of diabetes mellitus presenting today for follow-up evaluation of a callus/ulcer to the plantar aspect of the left great toe that is been present for about 3-4 years.  Patient states that he has been wearing the offloading felt pads to the shoes and he has noted significant improvement.  He took the oral antibiotics as prescribed and has been applying antibiotic cream.  No new complaints at this time   Past Medical History:  Diagnosis Date   Type 2 diabetes mellitus ([place])     Objective/Physical Exam General: The patient is alert and oriented x3 in no acute distress.  Dermatology:  Wound #1 noted to the plantar aspect of the left great toe measuring approximately 0.1 x 0.1 x 0.1 cm (LxWxD).   To the noted ulceration(s), there is no eschar.  Overall there is significant improvement.  The wound has mostly healed except for a small portion in the central area of the wound.  Periwound is callused.  Vascular: Palpable pedal pulses bilaterally. No edema or erythema noted.  Vascular status is intact and there is no concern for vascular compromise  Neurological: Epicritic and protective threshold diminished bilaterally.   Musculoskeletal Exam: Hallux valgus deformity noted left foot  Assessment: 1.  Ulcer plantar aspect of the first MTP joint left secondary to diabetes mellitus 2. diabetes mellitus w/ peripheral neuropathy 3.  Hallux valgus left   Plan of Care:  1. Patient was evaluated. 2. medically necessary excisional debridement including subcutaneous tissue was performed using a tissue nipper and a chisel blade. Excisional debridement of all the necrotic nonviable tissue down to healthy bleeding viable tissue was performed with post-debridement measurements same as pre-.  Periwound callus was also debrided today 3. the wound was cleansed and dry sterile dressing applied. 4.  Overall the wound has improved significantly and there is only a small  central portion of wound remaining.  I suspect this as well will heal uneventfully 5.  Continue gentamicin cream and a light dressing for the next few weeks 6.  Continue offloading felt dancers pads to all shoes 7.  Return to clinic in 6 weeks   Edrick Kins, DPM [facility]  Dr. Edrick Kins, DPM    [address], [place] [address]                Office [phone]  Fax [phone]

## 2021-04-14 ENCOUNTER — Other Ambulatory Visit: Payer: Self-pay

## 2021-04-14 ENCOUNTER — Ambulatory Visit (INDEPENDENT_AMBULATORY_CARE_PROVIDER_SITE_OTHER): Payer: Self-pay | Admitting: Podiatry

## 2021-04-14 DIAGNOSIS — M2042 Other hammer toe(s) (acquired), left foot: Secondary | ICD-10-CM

## 2021-04-14 NOTE — Progress Notes (Signed)
? ?  HPI: 61 y.o. male presenting today for follow-up evaluation regarding an ulcer to the plantar aspect of the first MTP joint left foot.  Patient states that the callus is doing much better.  He believes that it is healed.  He would like to discuss the symptomatic hammertoe to the second digit of the left foot which continues to have recurrent ulcers overlying the PIPJ.  Patient states that over the years he develops ulcers depending on his shoes that he wears.  It is constantly painful and rubbing in his shoes.  Presenting for further treatment and evaluation ? ?Past Medical History:  ?Diagnosis Date  ? Type 2 diabetes mellitus (HCC)   ? ?  ? ?Objective: ?Physical Exam ?General: The patient is alert and oriented x3 in no acute distress. ? ?Dermatology: Skin is cool, dry and supple bilateral lower extremities. Negative for open lesions or macerations. ? ?Vascular: Palpable pedal pulses bilaterally. No edema or erythema noted. Capillary refill within normal limits. ? ?Neurological: Epicritic and protective threshold grossly intact bilaterally.  ? ?Musculoskeletal Exam: All pedal and ankle joints range of motion within normal limits bilateral. Muscle strength 5/5 in all groups bilateral. Hammertoe contracture deformity noted to digits second digit of the left foot. ? ?Radiographic Exam 02/10/2021 LT foot: Hammertoe contracture deformity noted to the interphalangeal joints and MPJ of the hammertoe second digit.  Absence of the third digit noted at the level of the MTP ? ? ?Assessment: ?1.  PSxHx third toe amputation LT ?2.  Hammertoe deformity second LT ?3.  Ulcer plantar aspect of the first MTP joint; resolved ? ? ?Plan of Care:  ?1. Patient evaluated. X-Rays reviewed that were taken 02/10/2021.  ?2.  Continue good supportive shoes that do not irritate the toe ?3.  Given the patient's state of the hammertoe with history of recurrent ulcers he does remain high risk of ulceration and eventually losing the toe secondary  to infection.  I do believe surgical intervention would be warranted at this time.  It is very symptomatic and painful despite multiple attempts at different shoe gear ?4. Today we discussed the conservative versus surgical management of the presenting pathology. The patient opts for surgical management. All possible complications and details of the procedure were explained. All patient questions were answered. No guarantees were expressed or implied. ?5. Authorization for surgery was initiated today. Surgery will consist of PIPJ arthroplasty with MTP capsulotomy second digit left.  This will be a minimally invasive procedure performed in the office rather than extensive surgery which would be more aggressive at the surgery center.  If we did take the patient to the surgery center we would likely be more aggressive and perform bunionectomy surgery as well however the patient declined this option ?6.  Return to clinic morning of surgery in office ? ?  ? ?Felecia Shelling, DPM ?[facility] ? ?Dr. Felecia Shelling, DPM  ?  ?[address]                                        ?[place], [place] [address]                ?Office [phone]  ?Fax [phone] ? ? ? ? ? ?

## 2021-04-29 ENCOUNTER — Encounter: Payer: Self-pay | Admitting: Sports Medicine

## 2021-04-29 ENCOUNTER — Ambulatory Visit (INDEPENDENT_AMBULATORY_CARE_PROVIDER_SITE_OTHER): Payer: Self-pay | Admitting: Sports Medicine

## 2021-04-29 ENCOUNTER — Ambulatory Visit: Payer: Self-pay

## 2021-04-29 DIAGNOSIS — Z89422 Acquired absence of other left toe(s): Secondary | ICD-10-CM

## 2021-04-29 DIAGNOSIS — M2012 Hallux valgus (acquired), left foot: Secondary | ICD-10-CM

## 2021-04-29 DIAGNOSIS — M2042 Other hammer toe(s) (acquired), left foot: Secondary | ICD-10-CM

## 2021-04-29 DIAGNOSIS — L97522 Non-pressure chronic ulcer of other part of left foot with fat layer exposed: Secondary | ICD-10-CM

## 2021-04-29 DIAGNOSIS — L02612 Cutaneous abscess of left foot: Secondary | ICD-10-CM

## 2021-04-29 DIAGNOSIS — L03032 Cellulitis of left toe: Secondary | ICD-10-CM

## 2021-04-29 DIAGNOSIS — E0843 Diabetes mellitus due to underlying condition with diabetic autonomic (poly)neuropathy: Secondary | ICD-10-CM

## 2021-04-29 MED ORDER — AMOXICILLIN-POT CLAVULANATE 875-125 MG PO TABS: 1.0000 | ORAL_TABLET | Freq: Two times a day (BID) | ORAL | 0 refills | Status: DC

## 2021-04-29 NOTE — Progress Notes (Signed)
Subjective: ?Kenneth Richards is a 61 y.o. male patient seen in office for evaluation of significant redness and swelling that he noticed 2 days ago at the left second toe.  Patient reports that he is scheduled for an office surgery on this toe on Monday and wanted to have it checked.  Patient denies any current pain nausea vomiting fever chills or any constitutional symptoms at this time.  Patient states that he is not sure how this could have happened he does admit that he wears a dudes when he is not at work and then when he is at work he wears a leather work boot and because he has neuropathy and may have rubbed and he did not know it.  Patient denies any other concerns at this time and states that he wants to get it checked sooner before Monday because he was afraid that the toe would get really infected and he would be at risk of getting an amputated like he had prior with his third toe. ? ?Patient Active Problem List  ? Diagnosis Date Noted  ? Bilateral diabetic foot ulcer associated with secondary diabetes mellitus (HCC) 10/30/2020  ? Acute osteomyelitis of both feet (HCC) 10/30/2020  ? Type 2 diabetes mellitus with foot ulcer (HCC) 10/30/2020  ? Diabetic foot ulcer (HCC) 10/30/2020  ? Hyponatremia 10/30/2020  ? ?Current Outpatient Medications on File Prior to Visit  ?Medication Sig Dispense Refill  ? metFORMIN (GLUCOPHAGE) 1000 MG tablet Take by mouth.    ? Accu-Chek Softclix Lancets lancets Use as directed 100 each 0  ? acetaminophen (TYLENOL) 325 MG tablet Take 2 tablets (650 mg total) by mouth every 6 (six) hours as needed for mild pain (or Fever >/= 101).    ? Blood Glucose Monitoring Suppl (BLOOD GLUCOSE MONITOR SYSTEM) w/Device KIT Use as directed 1 kit 0  ? doxycycline (VIBRA-TABS) 100 MG tablet Take 1 tablet (100 mg total) by mouth 2 (two) times daily. 20 tablet 0  ? gentamicin cream (GARAMYCIN) 0.1 % Apply 1 application topically 2 (two) times daily. 30 g 1  ? glucose blood test strip Use as directed  up to 4 times daily. 100 strip 0  ? insulin aspart protamine - aspart (NOVOLOG 70/30 MIX) (70-30) 100 UNIT/ML FlexPen Inject 12 Units into the skin 2 (two) times daily with a meal. 15 mL 11  ? Insulin Pen Needle 32G X 4 MM MISC Use with insulin pen 100 each 0  ? metFORMIN (GLUCOPHAGE) 1000 MG tablet metformin 1,000 mg tablet ? TAKE 1 TABLET BY MOUTH TWICE A DAY WITH MEALS    ? metFORMIN (GLUCOPHAGE) 500 MG tablet Take 1 tablet (500 mg total) by mouth daily with breakfast for 3 days AND then take1 tablet (500 mg total) two times daily with a meal. 60 tablet 11  ? oxyCODONE-acetaminophen (PERCOCET/ROXICET) 5-325 MG tablet Take 1-2 tablets by mouth every 4 (four) hours as needed for moderate pain. 30 tablet 0  ? rosuvastatin (CRESTOR) 20 MG tablet Take 1 tablet by mouth daily.    ? Silver-Carboxymethylcellulose (AQUACEL AG ADVANTAGE) 4"X5" PADS Apply 1 packet topically daily. 30 each 1  ? ?No current facility-administered medications on file prior to visit.  ? ?No Known Allergies ? ?Recent Results (from the past 2160 hour(s))  ?House Account Tracking only     Status: None  ? Collection Time: 02/10/21  2:16 PM  ?Result Value Ref Range  ? Tracking House Account    ?  Comment: We were unable to   identify an account number for the ?order submitted. If you do not have a [facility]  ?account number or if your account information needs  ?to be updated please call [phone] ([phone]) ?for assistance. ?. ?To prevent delays in testing and processing of your ?orders please provide the following information for ?this order and with every additional order submitted: ?[facility] account number and account name  ?Client address ?Client phone and fax number ?NPI number of ordering physician along with the ?physician name. ?  ?WOUND CULTURE     Status: Abnormal  ? Collection Time: 02/10/21  2:16 PM  ?Result Value Ref Range  ? MICRO NUMBER: [id number]   ? SPECIMEN QUALITY: Adequate   ? SOURCE: L FOOT ULCER   ? STATUS: FINAL   ?  GRAM STAIN:    ?  No epithelial cells seen Few Polymorphonuclear leukocytes Moderate Gram positive cocci Many Gram negative coccobacilli Few Gram positive bacilli  ? RESULT:    ?  Additional organisms of questionable significance were isolated that normally do not warrant identification and susceptibilities. Please contact the laboratory within three days if identification and susceptibilities are clinically indicated.  ? ISOLATE 1: Group G Streptococcus (A)   ?  Comment: Scant growth of Group G Streptococcus Beta-hemolytic streptococci are predictably susceptible to Penicillin and other beta-lactams. Susceptibility testing not routinely performed. Please contact the laboratory within 3 days if susceptibility testing is  ?desired. ?  ? ? ?Objective: ?There were no vitals filed for this visit. ? ?General: Patient is awake, alert, oriented x 3 and in no acute distress. ? ?Dermatology: Skin is warm and dry bilateral with a full thickness ulceration present at the left second toe dorsal interphalangeal joint that measures 0.5 x 0.5 x 0.3 cm probes to fatty tissue no current bone exposed there is also significant soft tissue swelling and fluctuance once this wound was debrided there was seropurulent drainage present which was cultured as below.  There is no malodor. ?  ?Vascular: Dorsalis Pedis pulse = 1/4 Bilateral,  Posterior Tibial pulse = 1/4 Bilateral,  Capillary Fill Time < 5 seconds ? ?Neurologic: Protective sensation absent bilateral history of severe neuropathy.  ? ?Musculosketal: There is no pain with palpation to ulcerated area.  Hammertoe deformity.  Status post left third toe amputation.   ? ?Xrays, Left foot: At the second toe at area of concern there is no obvious bony destruction suggestive of osteomyelitis. No gas in soft tissues.  ? ?No results for input(s): GRAMSTAIN, LABORGA in the last 8760 hours. ? ?Assessment and Plan:  ?Problem List Items Addressed This Visit   ?None ?Visit Diagnoses   ? ? Ulcer  of left foot with fat layer exposed (HCC)    -  Primary  ? Relevant Orders  ? DG Foot Complete Left  ? WOUND CULTURE  ? Cellulitis and abscess of toe of left foot      ? 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 Hallux valgus, left      ? History of amputation of lesser toe of left foot (HCC)      ? Diabetes mellitus due to underlying condition with diabetic autonomic neuropathy, unspecified whether long term insulin use (HCC)      ? Relevant Medications  ? metFORMIN (GLUCOPHAGE) 1000 MG tablet  ? ?  ? ? ? ?-Examined patient and discussed the progression of the wound and treatment alternatives. ?-Xrays reviewed ?- Excisionally dedbrided ulceration and draining blister at the   dorsal interphalangeal joint of the left second toe.  The wound was debrided to healthy bleeding borders removing nonviable tissue using a sterile chisel blade. Wound measures post debridement as above.. Wound was debrided to the level of the dermis with viable wound base exposed to promote healing. Hemostasis was achieved with manuel pressure. Patient tolerated procedure well without any discomfort or anesthesia necessary for this wound debridement.  Wound culture was obtained and sent to pathology meanwhile started patient on Augmentin and advised patient to closely monitor swelling and redness if worsens to call office or go to ER or urgent care for IV antibiotics however at this time may continue to try to treat patient outpatient. ?-Applied Betadine and dry sterile dressing and instructed patient to continue with daily dressings at home consisting of same daily ?-Advised patient to return to using open toed surgical shoe or cam boot that he has at home to prevent rubbing and worsening of ulcer at toe ?- Advised patient to keep his appointment on Monday with Dr. Evans for an evaluation and advised him that his surgery may be postponed for altered if toe infection is still present ?-Patient to return to office as scheduled on Monday or  sooner if problems or issues arise. ?Titorya Stover, DPM ?

## 2021-04-29 NOTE — Progress Notes (Signed)
Ound H ?

## 2021-05-02 LAB — WOUND CULTURE
MICRO NUMBER:: 13259889
SPECIMEN QUALITY:: ADEQUATE

## 2021-05-03 ENCOUNTER — Ambulatory Visit (INDEPENDENT_AMBULATORY_CARE_PROVIDER_SITE_OTHER): Payer: Self-pay | Admitting: Podiatry

## 2021-05-03 DIAGNOSIS — M205X9 Other deformities of toe(s) (acquired), unspecified foot: Secondary | ICD-10-CM

## 2021-05-03 DIAGNOSIS — M2042 Other hammer toe(s) (acquired), left foot: Secondary | ICD-10-CM

## 2021-05-03 NOTE — Progress Notes (Signed)
? ?OPERATIVE REPORT ?Patient name: Kenneth Richards ?MRN: [id number] ?DOB: [birth year] ? ?DOS: 05/03/21 ? ?Preop Dx: Hammertoe second left ?Postop Dx: same ? ?Procedure:  ?1.  PIPJ arthroplasty second left ?2.  MTPJ capsulotomy second left ? ?Surgeon: Kenneth Richards DPM ? ?Anesthesia: 50-50 mixture of 2% lidocaine plain with 0.5% Marcaine plain totaling 6 mL infiltrated in the patient's left foot in a digital block fashion ? ?Hemostasis: None ? ?EBL: 10 mL ?Materials: None ?Injectables: None ?Pathology: None ? ?Condition: The patient tolerated the procedure and anesthesia well. No complications noted or reported ? ? ?Justification for procedure: The patient is a 61 y.o. male PMHx diabetes mellitus controlled who presents today for surgical correction of symptomatic hammertoe to the left second digit.  Patient did have an acute flareup of redness and swelling to the left second digit last week however he has been on doxycycline since 04/29/2021 and the redness and edema to the toe has significantly improved and mostly resolved.  There is a very stable wound overlying the dorsal aspect of the PIPJ second digit left.  No exposed bone. All conservative modalities of been unsuccessful in providing any sort of satisfactory alleviation of symptoms with the patient. The patient was told benefits as well as possible side effects of the surgery. The patient consented for surgical correction. The patient consent form was reviewed. All patient questions were answered. No guarantees were expressed or implied. ? ?Procedure in Detail: The patient was brought to the procedure room, placed in the procedure chair in the supine position at which time an aseptic scrub and drape were performed about the patient's respective lower extremity after digital block was induced as described above. Attention was then directed to the surgical area where procedure number one commenced. ? ?Procedure #1: Transverse PIPJ arthroplasty second left ?A  transverse elliptical type incision was planned and made overlying the PIPJ of the second digit left foot encompassing the entire dorsal wound as well.  The incision was carried down to the level of bone and transverse tenotomy's of the EDL tendon were also performed to allow exposure of the underlying joint.  The ellipse skin wedge was removed to expose the underlying joint.  At this time an osteotomy was performed at the surgical phalangeal neck of the proximal phalanx as well as the base of the middle phalanx.  All prominent portion of bone was removed satisfactorily and without complication.  Bone appeared healthy and viable.  The surgical area was then copiously irrigated with normal saline and dried and closure was obtained using 4-0 nylon suture. ? ?Procedure #2: Second MTPJ capsulotomy left ?A separate minimally invasive incision was planned and made directly overlying the second MTP of the left foot.  The incision measured approximately 0.5 mm.  This allowed insertion of a #15 scalpel and a transverse dorsal capsulotomy was performed at the MTPJ.  After the dorsal joint capsule was released the toe was in a more rectus alignment with alleviation of dorsal contracture at the MTP joint.  Again, 4-0 nylon suture was utilized to reapproximate the small percutaneous incision site. ? ?Dry sterile compressive dressings were then applied to all previously mentioned incision sites about the patient's lower extremity. The tourniquet which was used for hemostasis was deflated. All normal neurovascular responses including pink color and warmth returned all the digits of patient's lower extremity. ? ?The patient was then discharged.  Patient is neuropathic so no analgesics were prescribed.  Continue doxycycline 100 mg 2 times daily as  prescribed.  Verbal as well as written instructions were provided for the patient regarding wound care. The patient is to keep the dressings clean dry and intact until they are to follow  surgeon Dr. Gala Richards in the office upon discharge.  Postoperative shoe dispensed.  Return to clinic 1 week ? ?Kenneth Richards, DPM ?[facility] ? ?Dr. Felecia Richards, DPM  ?  ?[address]                                       ?[place], [place] [address]                ?Office [phone]  ?Fax [phone] ? ? ?

## 2021-05-10 ENCOUNTER — Ambulatory Visit (INDEPENDENT_AMBULATORY_CARE_PROVIDER_SITE_OTHER): Payer: Self-pay

## 2021-05-10 ENCOUNTER — Ambulatory Visit (INDEPENDENT_AMBULATORY_CARE_PROVIDER_SITE_OTHER): Payer: Self-pay | Admitting: Podiatry

## 2021-05-10 DIAGNOSIS — Z9889 Other specified postprocedural states: Secondary | ICD-10-CM

## 2021-05-10 NOTE — Progress Notes (Signed)
? ?  Subjective:  ?Patient presents today status post PIPJ arthroplasty with minimally in base of MTPJ capsulotomy second digit left foot performed in office on 05/03/2021.  Patient states that he is doing well.  He has kept the dressings clean dry and intact.  He has been minimally ambulatory in the surgical shoe as instructed.  No new complaints at this time ? ?Past Medical History:  ?Diagnosis Date  ? Type 2 diabetes mellitus (HCC)   ? ?Past Surgical History:  ?Procedure Laterality Date  ? AMPUTATION Bilateral 11/01/2020  ? Procedure: AMPUTATION 3rd LEFT TOE AND RIGHT GREAT  TOE;  Surgeon: Netta Cedars, MD;  Location: [facility];  Service: Orthopedics;  Laterality: Bilateral;  ? ?No Known Allergies ? ? ?Objective/Physical Exam ?Neurovascular status intact.  The skin incision overlying the PIPJ of the left second digit does appear to have slightly dehisced.  Sutures are intact however.  There is no purulence.  No malodor.  No clinical evidence of infection.  No active bleeding. ? ?Radiographic exam ?Arthroplasty noted of the second digit left foot.  Absence of the third digit noted from prior surgery at the level of the MTP ? ?Assessment: ?1. s/p PIPJ arthroplasty with MTPJ capsulotomy second digit left performed in office 05/03/2021 ? ?Plan of Care:  ?1. Patient was evaluated. X-rays reviewed ?2.  Two additional nylon sutures were utilized to better reapproximate the slightly dehisced incision site overlying the PIPJ.  Prior to the sutures about 1 mL of lidocaine 2% with epi was injected along the dorsum of the toe and the area was prepped aseptically. ?3.  Dry sterile dressings Betadine wet-to-dry were applied.  Keep clean dry and intact x1 week ?4.  Patient is currently on antibiotics from his PCP.  Continue until completed ?5.  Continue minimally weightbearing in the postsurgical shoe ?6.  Return to clinic 1 week ? ? ?Felecia Shelling, DPM ?[facility] ? ?Dr. Felecia Shelling, DPM  ?  ?[address]                                    ?[place], [place] [address]                ?Office [phone]  ?Fax [phone] ? ? ? ? ? ?

## 2021-05-17 ENCOUNTER — Ambulatory Visit (INDEPENDENT_AMBULATORY_CARE_PROVIDER_SITE_OTHER): Payer: Self-pay | Admitting: Podiatry

## 2021-05-17 DIAGNOSIS — Z9889 Other specified postprocedural states: Secondary | ICD-10-CM

## 2021-05-17 NOTE — Progress Notes (Signed)
? ?  Subjective:  ?Patient presents today status post PIPJ arthroplasty with minimally in base of MTPJ capsulotomy second digit left foot performed in office on 05/03/2021.  Patient states that he is doing well.  He has been weightbearing in the surgical shoe as instructed.  No new complaints at this time ? ?Past Medical History:  ?Diagnosis Date  ? Type 2 diabetes mellitus (HCC)   ? ?Past Surgical History:  ?Procedure Laterality Date  ? AMPUTATION Bilateral 11/01/2020  ? Procedure: AMPUTATION 3rd LEFT TOE AND RIGHT GREAT  TOE;  Surgeon: Netta Cedars, MD;  Location: [facility];  Service: Orthopedics;  Laterality: Bilateral;  ? ?No Known Allergies ? ? ?Objective/Physical Exam ?Neurovascular status intact.  The skin incision overlying the PIPJ of the left second digit is well coapted.  Sutures intact.  No purulence.  No malodor.  No drainage. ? ?Assessment: ?1. s/p PIPJ arthroplasty with MTPJ capsulotomy second digit left performed in office 05/03/2021 ? ?Plan of Care:  ?1. Patient was evaluated.  ?2.  The incision site appears well coapted and healing appropriately ?3.  Continue postsurgical shoe. ?4.  At this time the patient may begin to wash and shower and get the foot wet.  Recommend Betadine ointment and a Band-Aid daily.  Betadine ointment provided ?5.  Return to clinic in 1 week for possible suture removal ? ? ?Felecia Shelling, DPM ?[facility] ? ?Dr. Felecia Shelling, DPM  ?  ?[address]                                    ?[place], [place] [address]                ?Office [phone]  ?Fax [phone] ? ? ? ? ? ?

## 2021-05-26 ENCOUNTER — Ambulatory Visit (INDEPENDENT_AMBULATORY_CARE_PROVIDER_SITE_OTHER): Payer: Self-pay | Admitting: Podiatry

## 2021-05-26 DIAGNOSIS — Z9889 Other specified postprocedural states: Secondary | ICD-10-CM

## 2021-05-26 NOTE — Progress Notes (Signed)
? ?  Subjective:  ?Patient presents today status post PIPJ arthroplasty with minimally in base of MTPJ capsulotomy second digit left foot performed in office on 05/03/2021.  Overall the patient is doing very well.  He says that he has no pain to the area.  No new complaints at this time ? ?Past Medical History:  ?Diagnosis Date  ? Type 2 diabetes mellitus (HCC)   ? ?Past Surgical History:  ?Procedure Laterality Date  ? AMPUTATION Bilateral 11/01/2020  ? Procedure: AMPUTATION 3rd LEFT TOE AND RIGHT GREAT  TOE;  Surgeon: Netta Cedars, MD;  Location: [facility];  Service: Orthopedics;  Laterality: Bilateral;  ? ?No Known Allergies ? ? ?Objective/Physical Exam ?Neurovascular status intact.  The skin incision overlying the PIPJ of the left second digit is well coapted.  Sutures intact.  No purulence.  No malodor.  No drainage.  Well-healing surgical toe ? ?Assessment: ?1. s/p PIPJ arthroplasty with MTPJ capsulotomy second digit left performed in office 05/03/2021 ? ?Plan of Care:  ?1. Patient was evaluated.  ?2.  Sutures removed today ?3.  Patient may transition out of the surgical shoe into good supportive shoes and sneakers ?4.  Patient may now increase to full activity no restrictions  ?5.  Return to clinic in 1 month for final follow-up x-ray ? ? ?Felecia Shelling, DPM ?[facility] ? ?Dr. Felecia Shelling, DPM  ?  ?[address]                                    ?[place], [place] [address]                ?Office [phone]  ?Fax [phone] ? ? ? ? ? ?

## 2021-05-31 ENCOUNTER — Encounter: Payer: Self-pay | Admitting: Podiatry

## 2021-06-23 ENCOUNTER — Ambulatory Visit (INDEPENDENT_AMBULATORY_CARE_PROVIDER_SITE_OTHER): Payer: Self-pay | Admitting: Podiatry

## 2021-06-23 DIAGNOSIS — Z9889 Other specified postprocedural states: Secondary | ICD-10-CM

## 2021-06-23 NOTE — Progress Notes (Signed)
   Subjective:  Patient presents today status post PIPJ arthroplasty with minimally in base of MTPJ capsulotomy second digit left foot performed in office on 05/03/2021.  Overall the patient is doing very well.  He believes that the toe has healed nicely.  No new complaints at this time Past Medical History:  Diagnosis Date   Type 2 diabetes mellitus ([place])    Past Surgical History:  Procedure Laterality Date   AMPUTATION Bilateral 11/01/2020   Procedure: AMPUTATION 3rd LEFT TOE AND RIGHT GREAT  TOE;  Surgeon: Armond Hang, MD;  Location: [facility];  Service: Orthopedics;  Laterality: Bilateral;   No Known Allergies   Objective/Physical Exam Neurovascular status intact.  The skin incision is healed.  Well-healed surgical toe  Assessment: 1. s/p PIPJ arthroplasty with MTPJ capsulotomy second digit left performed in office 05/03/2021  Plan of Care:  1. Patient was evaluated.  2.  The toe has healed nicely.  Patient has no symptoms associated to the toe. 3.  Recommend good supportive shoes and sneakers that allow plenty of room in the toebox area 4.  Patient may now resume full activity no restrictions  5.  Return to clinic as needed   Edrick Kins, DPM [facility]  Dr. Edrick Kins, DPM    [address], [place] [address]                Office [phone]  Fax [phone]

## 2021-07-26 ENCOUNTER — Ambulatory Visit: Payer: Self-pay | Admitting: Podiatry

## 2022-04-29 IMAGING — MR MR FOOT*L* WO/W CM
9 series · 40 of 40 positions shown · IV contrast (gadavist)
Comparison: X-ray 10/30/2020

CLINICAL DATA: Diabetic foot wound involving the left third toe

EXAM:
MRI OF THE LEFT FOREFOOT WITHOUT AND WITH CONTRAST
TECHNIQUE: Multiplanar, multisequence MR imaging of the left foot was performed
both before and after administration of intravenous contrast.
CONTRAST:  7.5mL GADAVIST GADOBUTROL 1 MMOL/ML IV SOLN

[Series 13: T1 · axial · left · 3.0mm · 0.78mm/px · z∈[-73,+46]mm · 4 of 32 slices shown (1 of 2)]
[im 1/32]
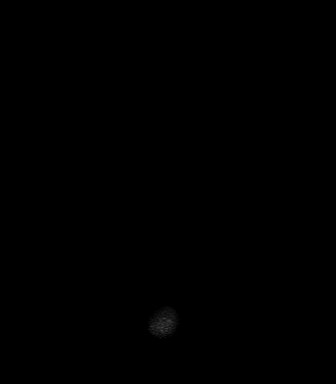
[im 11/32]
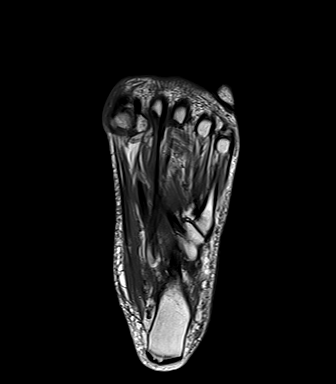
[im 21/32]
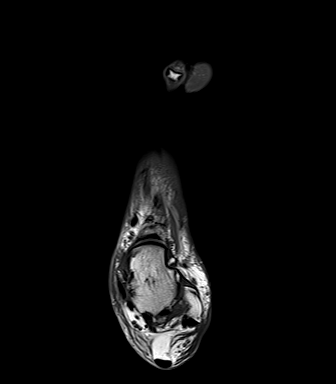
[im 32/32]
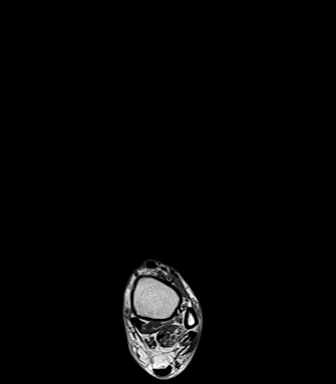

[Series 14: T2 fat-sat · axial · left · 3.0mm · 0.78mm/px · z∈[-71,+48]mm · 4 of 32 slices shown (1 of 2)]
[im 1/32]
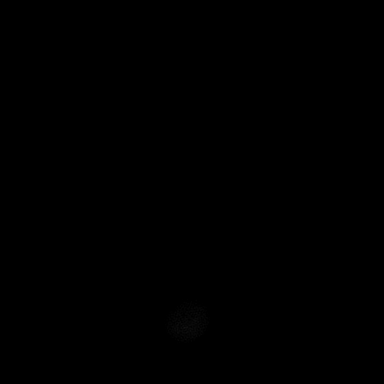
[im 11/32]
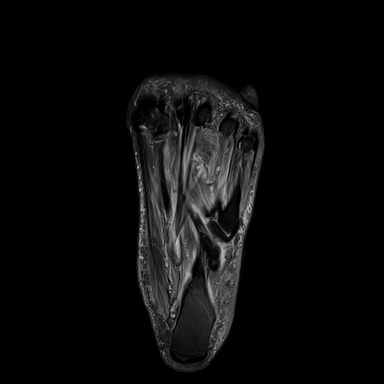
[im 21/32]
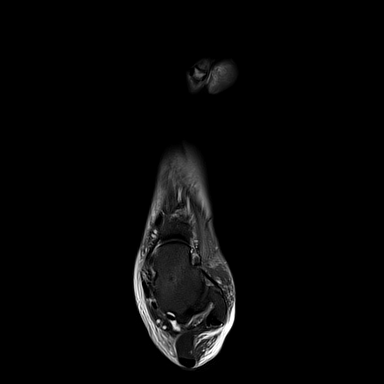
[im 32/32]
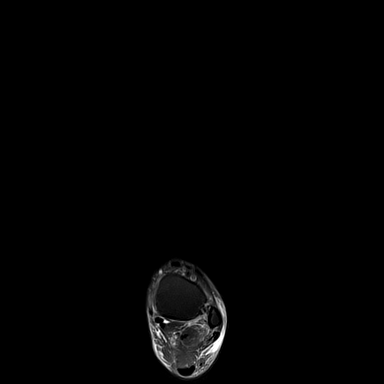

[Series 15: T1 · coronal · left · 3.0mm · 0.62mm/px · 7 of 68 slices shown (2 of 2)]
[im 1/68]
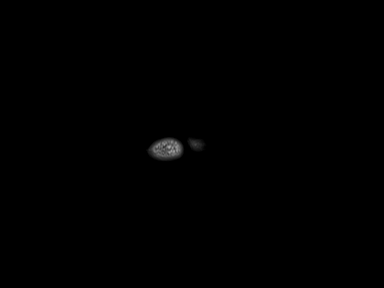
[im 12/68]
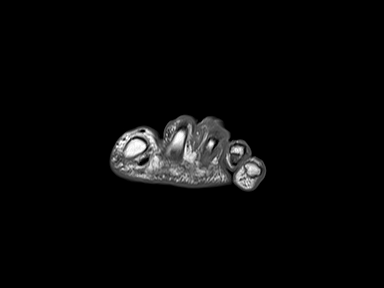
[im 23/68]
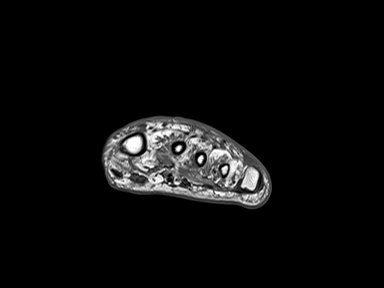
[im 34/68]
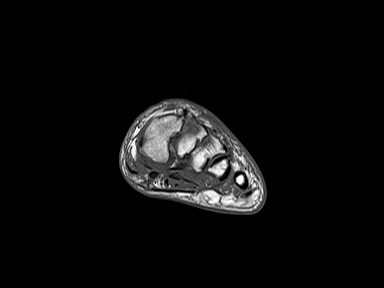
[im 45/68]
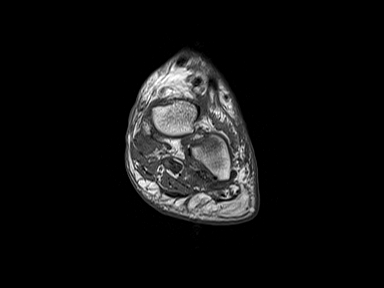
[im 56/68]
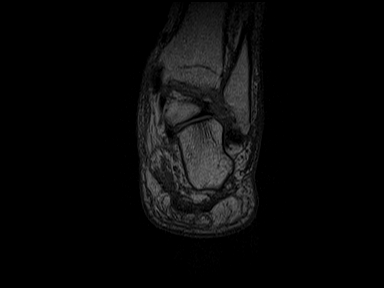
[im 68/68]
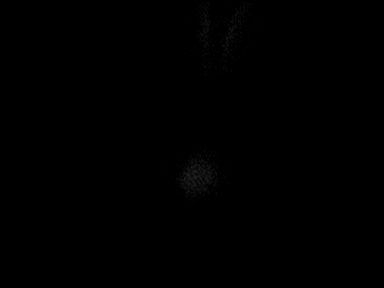

[Series 16: T2 fat-sat · coronal · left · 3.0mm · 0.71mm/px · 7 of 68 slices shown (2 of 2)]
[im 1/68]
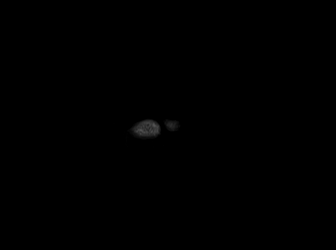
[im 12/68]
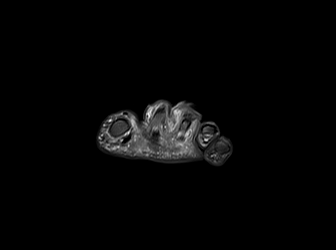
[im 23/68]
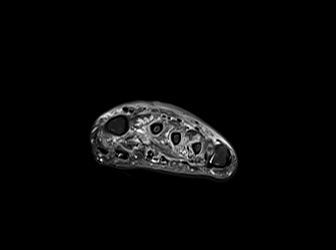
[im 34/68]
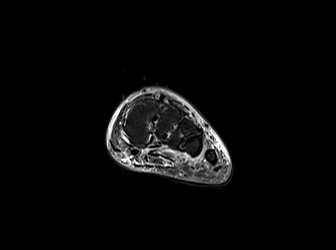
[im 45/68]
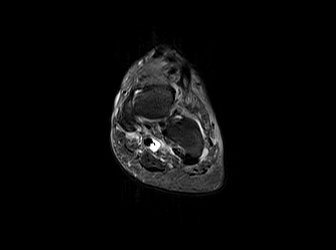
[im 56/68]
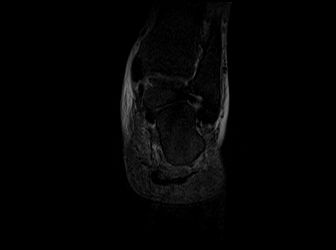
[im 68/68]
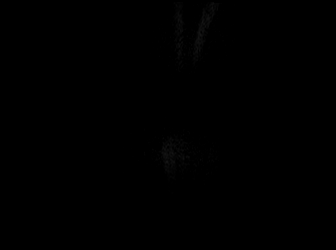

[Series 17: STIR · sagittal · left · 3.0mm · 1.17mm/px · 3 of 26 slices shown]
[im 1/26]
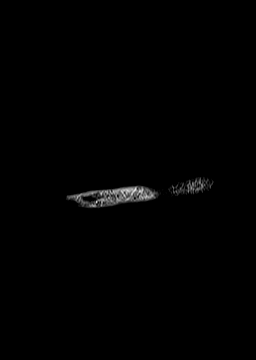
[im 13/26]
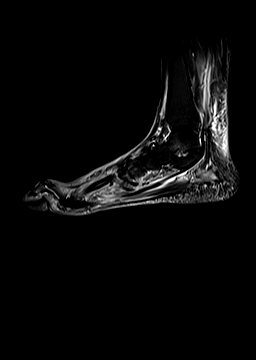
[im 26/26]
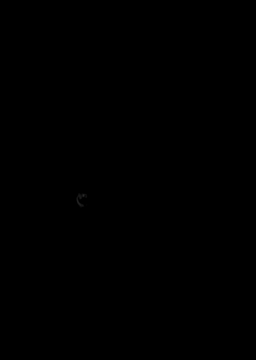

[Series 18: T1 fat-sat · axial · non-contrast · left · 3.0mm · 0.99mm/px · z∈[-73,+45]mm · 3 of 29 slices shown (1 of 3)]
[im 1/29]
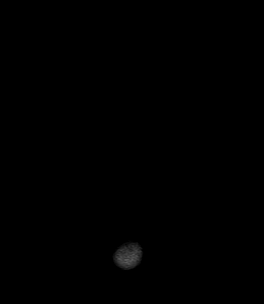
[im 15/29]
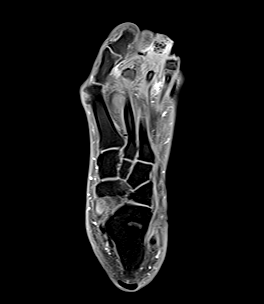
[im 29/29]
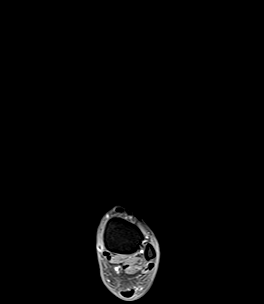

[Series 26: T1 fat-sat post-contrast · axial · left · 3.0mm · 0.99mm/px · z∈[-98,+20]mm · 3 of 32 slices shown]
[im 1/32]
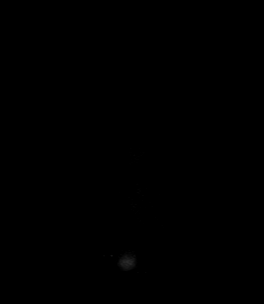
[im 16/32]
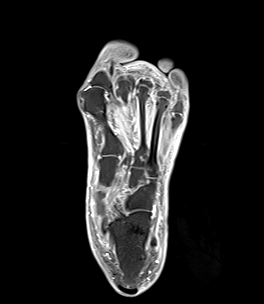
[im 32/32]
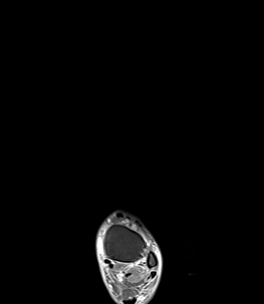

[Series 27: T1 fat-sat · sagittal · left · 3.0mm · 0.99mm/px · 2 of 22 slices shown (2 of 3)]
[im 1/22]
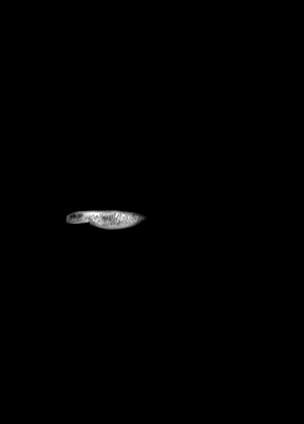
[im 22/22]
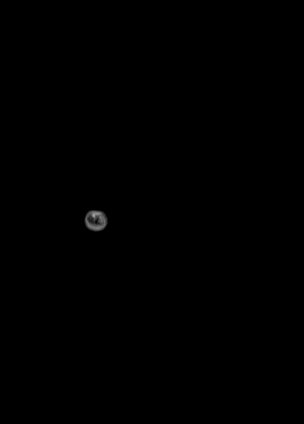

[Series 28: T1 fat-sat · coronal · left · 3.0mm · 0.75mm/px · 7 of 68 slices shown (3 of 3)]
[im 1/68]
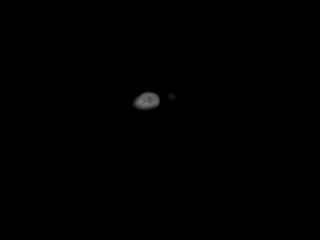
[im 12/68]
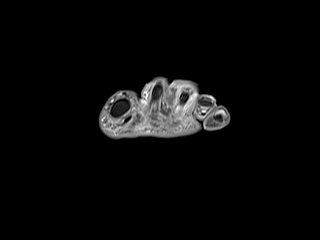
[im 23/68]
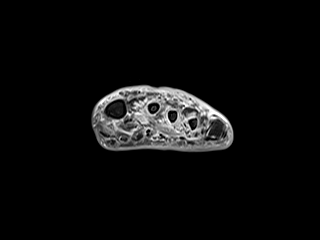
[im 34/68]
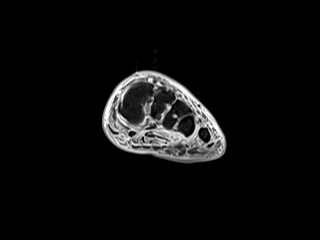
[im 45/68]
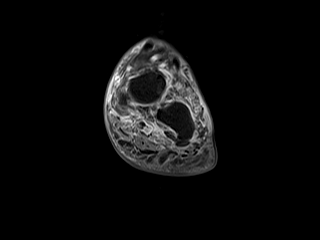
[im 56/68]
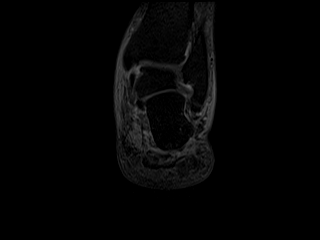
[im 68/68]
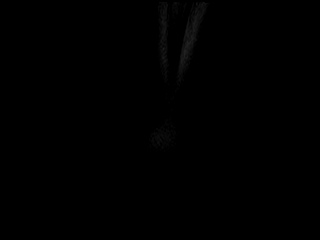

[40 of 40 positions shown; findings below may reference images not displayed]

FINDINGS: Bones/Joint/Cartilage

Bony destruction involving the distal phalanx of the left third toe
with associated confluent low T1 marrow signal compatible with acute
osteomyelitis. Mild bone marrow edema within the middle phalanx of
the third toe without definite bony destruction or marrow
replacement.

Remaining osseous structures of the left foot are intact. No
fracture. No dislocation. No additional sites of bone destruction or
marrow replacement. Hallux valgus deformity with osteoarthritis of
the first MTP joint.

Ligaments

Intact Lisfranc ligament. No evidence of acute ligamentous injury of
the foot.

Muscles and Tendons

Denervation changes of the intrinsic foot musculature. No
significant tenosynovial fluid collection.

Soft tissues

Ulceration involving the distal aspect of the third toe with soft
tissue swelling and edema. No organized or rim enhancing fluid
collection. Small volume third intermetatarsal space bursitis.
IMPRESSION: 1. Acute osteomyelitis of the left third toe distal phalanx.
2. Ulceration involving the distal aspect of the left third toe with
associated cellulitis. No abscess.
3. Mild bone marrow edema within the middle phalanx of the third toe
without definite bony destruction or marrow replacement, suggesting
reactive osteitis.
4. Small volume third intermetatarsal space bursitis.
5. Hallux valgus deformity with osteoarthritis of the first MTP
joint.

## 2022-09-23 NOTE — Progress Notes (Signed)
This encounter was created in error - please disregard.

## 2023-03-17 LAB — COLOGUARD: COLOGUARD: POSITIVE — AB

## 2023-10-16 ENCOUNTER — Encounter: Payer: Self-pay | Admitting: Podiatry

## 2023-10-16 ENCOUNTER — Ambulatory Visit (INDEPENDENT_AMBULATORY_CARE_PROVIDER_SITE_OTHER): Admitting: Podiatry

## 2023-10-16 ENCOUNTER — Ambulatory Visit

## 2023-10-16 VITALS — Ht 72.0 in | Wt 160.0 lb

## 2023-10-16 DIAGNOSIS — M2041 Other hammer toe(s) (acquired), right foot: Secondary | ICD-10-CM | POA: Diagnosis not present

## 2023-10-23 ENCOUNTER — Telehealth: Payer: Self-pay | Admitting: Podiatry

## 2023-10-23 NOTE — Telephone Encounter (Signed)
 Attempted to contact patient to scheduled office surgery with Dr.Evans- unable to leave message as mailbox is not set up.

## 2023-10-24 NOTE — Progress Notes (Signed)
 Chief Complaint  Patient presents with   Hammer Toe    Pt is here due to hammertoes on the right foot, pt states the the toes curved under, no pain but causes blisters on top of the toes due to them rubbing on the shoe.    HPI: 63 y.o. male PMHx T2DM, last A1c on 09/20/2023 was 7.4, presenting today for a new complaint of pain and tenderness associated to the second third digit of the right foot hammertoe deformities.  Patient states that he developed blisters on top of the toes when he wears any close toed shoes.  He has tried different shoes without any improvement.   Past Medical History:  Diagnosis Date   Type 2 diabetes mellitus (HCC)     Past Surgical History:  Procedure Laterality Date   AMPUTATION Bilateral 11/01/2020   Procedure: AMPUTATION 3rd LEFT TOE AND RIGHT GREAT  TOE;  Surgeon: Barton Drape, MD;  Location: [facility];  Service: Orthopedics;  Laterality: Bilateral;    No Known Allergies   Physical Exam: General: The patient is alert and oriented x3 in no acute distress.  Dermatology: Skin is warm, dry and supple bilateral lower extremities.   Vascular: Palpable pedal pulses bilaterally. Capillary refill within normal limits.  No appreciable edema.  No erythema.  Neurological: Light touch and protective threshold diminished  Musculoskeletal Exam: History of amputation to the great toe right foot.  Hammertoe contracture deformity noted specifically to the second third digit of the right foot  Radiographic Exam RT foot 10/16/2023:  Interval amputation of the great toe at the level of the MTP without complication.  No irregularities or erosions noted concerning for osteomyelitis.  Hammertoe contracture deformity with lateral deviation of the digits 2 and 3 of the right foot  Assessment/Plan of Care: 1. H/o PIPJ arthroplasty with MTPJ capsulotomy second digit left performed in office 05/03/2021  2.  Symptomatic hammertoe 2nd and 3rd digit right foot  -Patient  evaluated.  X-rays reviewed -The patient has had very good success with correction of the hammertoe to the left foot.  He is hoping to have the same procedure performed to the right foot since the second toe is becoming very symptomatic and painful and rubs against shoes.  He is concerned as well with winter coming up and having to wear close toed shoes -Today again we discussed surgery which could be performed here in the office which would consist of a PIPJ arthroplasty with MTP capsulotomy to the second digit of the right foot.  Risk benefits advantages and disadvantages the procedure were explained in detail to the patient.  No guarantees were expressed or implied.  All patient questions were answered.  He would like to proceed with an office surgery at this time -Authorization for surgery was initiated today.  Surgery will consist of PIPJ arthroplasty with MTP capsulotomy second digit right foot performed here in the office in our procedure room -Return to clinic morning of in office surgery       Thresa EMERSON Sar, DPM [facility]  Dr. Thresa EMERSON Sar, DPM    [address], [place] [address]                Office [phone]  Fax [phone]

## 2023-10-30 ENCOUNTER — Telehealth: Payer: Self-pay | Admitting: Podiatry

## 2023-10-30 NOTE — Telephone Encounter (Signed)
 Patient called in and left message on voicemail. I reached back out to let him know we received call and I would get back with him in regards to some dates we would be able to schedule office surgery.

## 2023-10-31 ENCOUNTER — Telehealth: Payer: Self-pay | Admitting: Podiatry

## 2023-10-31 NOTE — Telephone Encounter (Signed)
 DOS- 11/13/2023  HAMMERTOE REPAIR 2ND+3RD RT- 71714  [employer] EFFECTIVE DATE- 03/18/2023  DEDUCTIBLE- $257 REMAINING- $0 OOP- $9350 REMAINING- $9003.95 COINSURANCE- 20%  PER COHERE PORTAL, PRIOR AUTH FOR CPT CODE 71714 (2 UNITS) HAS BEEN APPROVED FROM 11/13/2023-02/13/2024. AUTH# [id number]

## 2023-11-01 ENCOUNTER — Telehealth: Payer: Self-pay | Admitting: Podiatry

## 2023-11-01 NOTE — Telephone Encounter (Signed)
 Patient called inquiring about the down time after surgery. He has friends that are willing to help him during his recovery he just would like to give them a time frame. Also patient is wanting to know when he would be expected to be able to drive again. Please advise.

## 2023-11-10 ENCOUNTER — Ambulatory Visit: Admitting: Podiatry

## 2023-11-13 ENCOUNTER — Ambulatory Visit (INDEPENDENT_AMBULATORY_CARE_PROVIDER_SITE_OTHER): Admitting: Podiatry

## 2023-11-13 VITALS — BP 142/69 | HR 53 | Resp 18

## 2023-11-13 DIAGNOSIS — M2041 Other hammer toe(s) (acquired), right foot: Secondary | ICD-10-CM | POA: Diagnosis not present

## 2023-11-13 MED ORDER — DOXYCYCLINE HYCLATE 100 MG PO TABS: 100.0000 mg | ORAL_TABLET | Freq: Two times a day (BID) | ORAL | 0 refills | Status: AC

## 2023-11-13 MED ORDER — HYDROCODONE-ACETAMINOPHEN 10-325 MG PO TABS: 1.0000 | ORAL_TABLET | ORAL | 0 refills | Status: AC | PRN

## 2023-11-13 NOTE — Progress Notes (Signed)
 OPERATIVE REPORT Patient name: Kenneth Richards MRN: [id number] DOB: 19-Sep-1960  DOS:  11/13/23  Preop Dx: Gwynda 2nd and 3rd digit right foot Postop Dx: same  Procedure:  1.  PIPJ arthroplasty with MTP capsulotomy second digit right foot 2.  PIPJ arthroplasty with MTP capsulotomy third digit right foot  Surgeon: Thresa EMERSON Sar DPM  Anesthesia: 50-50 mixture of 2% lidocaine  plain 0.5% Marcaine plain totaling 15 mL infiltrated around the patient's second third digit right foot  Hemostasis: Ankle tourniquet inflated to a pressure of 2 and 50 mg of mercury  EBL: 10 mL Materials: None Injectables: None Pathology: None  Condition: The patient tolerated the procedure and anesthesia well. No complications noted or reported   Justification for procedure: The patient is a 63 y.o. male who presents today for correction of preulcerative callus lesions secondary to severe hammertoe contracture of the second third digit of the right foot.  History of right great toe amputation. Conservative modalities of been unsuccessful in providing any sort of satisfactory alleviation of symptoms with the patient. The patient was told benefits as well as possible side effects of the surgery. The patient consented for surgical correction. The patient consent form was reviewed. All patient questions were answered. No guarantees were expressed or implied.   Procedure in Detail: The patient was brought to the procedure room, placed in the procedure chair in the supine position at which time an aseptic scrub and drape were performed about the patient's respective lower extremity after anesthesia was induced as described above. Attention was then directed to the surgical area where procedure number one commenced.  Procedure #1: Hammertoe arthroplasty with MTP capsulotomy second digit right Transverse elliptical incision was planned and made overlying the PIPJ of the second digit right foot.  The incision was  carried down to the level of joint capsule with transverse tenotomy performed of the EDL tendon.  Capsulotomy was using a #15 scalpel achieved achieved and soft tissue was reflected away from the hypertrophic head of the proximal phalanx causing the irritation and overlying callus formation due to rubbing in the shoes.  The head of the proximal phalanx was removed in toto by performing an osteotomy using bone cutting forceps at the phalangeal neck of the proximal phalanx.  Additional pathology existed more proximal at the level of the MTP.  A  small percutaneous incision was utilized at the level of the MTP to create a dorsal capsulotomy using #15 scalpel.  The dorsal capsule was released and the toe was in a more rectus alignment after alleviation of the dorsal contracture.  Irrigation was then utilized followed by primary closure using a combination of 4-0 Vicryl and 4-0 Prolene suture.   Procedure #2: Hammertoe arthroplasty with MTP capsulotomy third digit right  Procedure #2 commenced in the exact same fashion manner as procedure #1 with the exception that procedure #2 was performed to the third digit of the right foot.  Primary closure achieved in the same manner  Dry sterile compressive dressings were then applied about the patient's lower extremity. The patient was then discharged from the office with adequate prescriptions for analgesia. Verbal as well as written instructions were provided for the patient regarding postprocedural care. The patient is to keep the dressings clean dry and intact until they are to follow up in the office upon discharge in one week.   Thresa EMERSON Sar, DPM [facility]  Dr. Thresa EMERSON Sar, DPM    [address]  [place], [place] [address]                Office [phone]  Fax [phone]

## 2023-11-13 NOTE — Addendum Note (Signed)
 Addended by: JANIT THRESA HERO on: 11/13/2023 08:35 AM   Modules accepted: Orders

## 2023-11-22 ENCOUNTER — Ambulatory Visit (INDEPENDENT_AMBULATORY_CARE_PROVIDER_SITE_OTHER)

## 2023-11-22 ENCOUNTER — Encounter: Payer: Self-pay | Admitting: Podiatry

## 2023-11-22 ENCOUNTER — Ambulatory Visit (INDEPENDENT_AMBULATORY_CARE_PROVIDER_SITE_OTHER): Admitting: Podiatry

## 2023-11-22 VITALS — Ht 72.0 in | Wt 160.0 lb

## 2023-11-22 DIAGNOSIS — M2041 Other hammer toe(s) (acquired), right foot: Secondary | ICD-10-CM

## 2023-11-22 NOTE — Progress Notes (Signed)
   No chief complaint on file.   Subjective:  Patient presents today status post hammertoe correction of the second third digit of the right foot.  WBAT postop shoe as instructed.  No new complaints on  Past Medical History:  Diagnosis Date   Type 2 diabetes mellitus (HCC)     Past Surgical History:  Procedure Laterality Date   AMPUTATION Bilateral 11/01/2020   Procedure: AMPUTATION 3rd LEFT TOE AND RIGHT GREAT  TOE;  Surgeon: Barton Drape, MD;  Location: [facility];  Service: Orthopedics;  Laterality: Bilateral;    No Known Allergies  Objective/Physical Exam Neurovascular status intact.  Incision well coapted with sutures intact.  No erythema or drainage to the incision sites concerning for infection.  Chronic moderate edema noted to the surgical extremity.  Radiographic Exam RT foot 11/22/2023:  Arthroplasty noted to the second third digit of the right foot PIPJ.  Routine healing noted.  No gas within the tissues.  Improved alignment of the toes  Assessment: 1. s/p hammertoe arthroplasty 2nd and 3rd digits right foot. DOS: 11/13/2023. In-office procedure.   Plan of Care:  -Patient was evaluated. X-rays reviewed - Dressings changed.  Leave clean dry and intact x 1 week.  After that he may begin washing and showering and getting the foot wet -WBAT surgical shoe -Return to clinic 1 week suture removal   Thresa EMERSON Sar, DPM [facility]  Dr. Thresa EMERSON Sar, DPM    [address] [place], [place] [address]                Office [phone]  Fax [phone]

## 2023-11-29 ENCOUNTER — Encounter: Payer: Self-pay | Admitting: Podiatry

## 2023-11-29 ENCOUNTER — Ambulatory Visit (INDEPENDENT_AMBULATORY_CARE_PROVIDER_SITE_OTHER): Admitting: Podiatry

## 2023-11-29 VITALS — Ht 72.0 in | Wt 160.0 lb

## 2023-11-29 DIAGNOSIS — M2041 Other hammer toe(s) (acquired), right foot: Secondary | ICD-10-CM

## 2023-11-29 NOTE — Progress Notes (Signed)
   Chief Complaint  Patient presents with   Routine Post Op    Pt is here to f/u on right foot due to surgery for hammertoes, he states that everything is going well, and has no complaints.    Subjective:  Patient presents today status post hammertoe correction of the second third digit of the right foot.  WBAT postop shoe as instructed.  No new complaints on  Past Medical History:  Diagnosis Date   Type 2 diabetes mellitus (HCC)     Past Surgical History:  Procedure Laterality Date   AMPUTATION Bilateral 11/01/2020   Procedure: AMPUTATION 3rd LEFT TOE AND RIGHT GREAT  TOE;  Surgeon: Barton Drape, MD;  Location: [facility] OR;  Service: Orthopedics;  Laterality: Bilateral;    No Known Allergies  Objective/Physical Exam Neurovascular status intact.  Incision well coapted with sutures intact.  No erythema or drainage to the incision sites concerning for infection.  Chronic moderate edema noted to the surgical extremity.  Radiographic Exam RT foot 11/22/2023:  Arthroplasty noted to the second third digit of the right foot PIPJ.  Routine healing noted.  No gas within the tissues.  Improved alignment of the toes  Assessment: 1. s/p hammertoe arthroplasty 2nd and 3rd digits right foot. DOS: 11/13/2023. In-office procedure.   Plan of Care:  -Patient was evaluated.  -Sutures removed -Okay to slowly transition of the postop shoe using good supportive tennis shoes and sneakers -Return to clinic PRN  Thresa EMERSON Sar, DPM [facility]  Dr. Thresa EMERSON Sar, DPM    [address] [place], [place] [address]                Office [phone]  Fax [phone]
